# Patient Record
Sex: Male | Born: 1980
Health system: Southern US, Community
[De-identification: ages and names within clinical notes are randomized; demographics above are authoritative.]

## PROBLEM LIST (undated history)

## (undated) ENCOUNTER — Ambulatory Visit: Payer: BLUE CROSS/BLUE SHIELD | Source: Home / Self Care

## (undated) DIAGNOSIS — R519 Headache, unspecified: Secondary | ICD-10-CM

## (undated) DIAGNOSIS — I10 Essential (primary) hypertension: Secondary | ICD-10-CM

## (undated) DIAGNOSIS — R51 Headache: Secondary | ICD-10-CM

## (undated) DIAGNOSIS — E119 Type 2 diabetes mellitus without complications: Secondary | ICD-10-CM

## (undated) HISTORY — DX: Headache, unspecified: R51.9

## (undated) HISTORY — DX: Type 2 diabetes mellitus without complications: E11.9

## (undated) HISTORY — DX: Headache: R51

## (undated) HISTORY — PX: ARTHROSCOPIC REPAIR ACL: SUR80

---

## 2000-04-01 ENCOUNTER — Encounter: Payer: Self-pay | Admitting: Emergency Medicine

## 2000-04-01 ENCOUNTER — Emergency Department (HOSPITAL_COMMUNITY): Admission: EM | Admit: 2000-04-01 | Discharge: 2000-04-01 | Payer: Self-pay | Admitting: Emergency Medicine

## 2001-11-23 ENCOUNTER — Emergency Department (HOSPITAL_COMMUNITY): Admission: EM | Admit: 2001-11-23 | Discharge: 2001-11-23 | Payer: Self-pay | Admitting: Emergency Medicine

## 2001-12-28 ENCOUNTER — Emergency Department (HOSPITAL_COMMUNITY): Admission: EM | Admit: 2001-12-28 | Discharge: 2001-12-28 | Payer: Self-pay

## 2003-03-20 ENCOUNTER — Emergency Department (HOSPITAL_COMMUNITY): Admission: EM | Admit: 2003-03-20 | Discharge: 2003-03-20 | Payer: Self-pay | Admitting: Emergency Medicine

## 2003-03-20 ENCOUNTER — Encounter: Payer: Self-pay | Admitting: Emergency Medicine

## 2003-08-24 ENCOUNTER — Emergency Department (HOSPITAL_COMMUNITY): Admission: AD | Admit: 2003-08-24 | Discharge: 2003-08-24 | Payer: Self-pay | Admitting: Family Medicine

## 2003-08-24 ENCOUNTER — Emergency Department (HOSPITAL_COMMUNITY): Admission: EM | Admit: 2003-08-24 | Discharge: 2003-08-24 | Payer: Self-pay | Admitting: Emergency Medicine

## 2004-08-21 ENCOUNTER — Emergency Department (HOSPITAL_COMMUNITY): Admission: EM | Admit: 2004-08-21 | Discharge: 2004-08-21 | Payer: Self-pay | Admitting: Emergency Medicine

## 2006-01-27 ENCOUNTER — Emergency Department (HOSPITAL_COMMUNITY): Admission: EM | Admit: 2006-01-27 | Discharge: 2006-01-27 | Payer: Self-pay | Admitting: Emergency Medicine

## 2007-08-25 ENCOUNTER — Emergency Department (HOSPITAL_COMMUNITY): Admission: EM | Admit: 2007-08-25 | Discharge: 2007-08-25 | Payer: Self-pay | Admitting: Emergency Medicine

## 2008-02-11 ENCOUNTER — Emergency Department (HOSPITAL_COMMUNITY): Admission: EM | Admit: 2008-02-11 | Discharge: 2008-02-11 | Payer: Self-pay | Admitting: Emergency Medicine

## 2008-04-30 ENCOUNTER — Emergency Department (HOSPITAL_COMMUNITY): Admission: EM | Admit: 2008-04-30 | Discharge: 2008-04-30 | Payer: Self-pay | Admitting: Emergency Medicine

## 2012-11-16 ENCOUNTER — Encounter (HOSPITAL_COMMUNITY): Payer: Self-pay | Admitting: *Deleted

## 2012-11-16 ENCOUNTER — Emergency Department (HOSPITAL_COMMUNITY)
Admission: EM | Admit: 2012-11-16 | Discharge: 2012-11-16 | Disposition: A | Discharge status: Home / Self Care | Payer: Self-pay | Attending: Emergency Medicine | Admitting: Emergency Medicine

## 2012-11-16 DIAGNOSIS — J029 Acute pharyngitis, unspecified: Secondary | ICD-10-CM | POA: Insufficient documentation

## 2012-11-16 DIAGNOSIS — R52 Pain, unspecified: Secondary | ICD-10-CM | POA: Insufficient documentation

## 2012-11-16 DIAGNOSIS — J3489 Other specified disorders of nose and nasal sinuses: Secondary | ICD-10-CM | POA: Insufficient documentation

## 2012-11-16 DIAGNOSIS — I10 Essential (primary) hypertension: Secondary | ICD-10-CM | POA: Insufficient documentation

## 2012-11-16 DIAGNOSIS — R51 Headache: Secondary | ICD-10-CM | POA: Insufficient documentation

## 2012-11-16 DIAGNOSIS — R6889 Other general symptoms and signs: Secondary | ICD-10-CM

## 2012-11-16 DIAGNOSIS — R112 Nausea with vomiting, unspecified: Secondary | ICD-10-CM | POA: Insufficient documentation

## 2012-11-16 HISTORY — DX: Essential (primary) hypertension: I10

## 2012-11-16 MED ORDER — SODIUM CHLORIDE 0.9 % IV BOLUS (SEPSIS)
1000.0000 mL | Freq: Once | INTRAVENOUS | Status: AC
  Administered 2012-11-16: 1000 mL via INTRAVENOUS

## 2012-11-16 MED ORDER — KETOROLAC TROMETHAMINE 30 MG/ML IJ SOLN
30.0000 mg | Freq: Once | INTRAMUSCULAR | Status: AC
  Administered 2012-11-16: 30 mg via INTRAVENOUS
  Filled 2012-11-16: qty 1

## 2012-11-16 NOTE — ED Notes (Signed)
Pt reports headache, sore throat, bodyaches, fever, n/v since Sunday.

## 2012-11-16 NOTE — ED Provider Notes (Signed)
History     CSN: [id number]  Arrival date & time 11/16/12  1713   First MD Initiated Contact with Patient 11/16/12 1722      Chief Complaint  Patient presents with  . Influenza    (Consider location/radiation/quality/duration/timing/severity/associated sxs/prior treatment) Patient is a 32 y.o. male presenting with general illness. The history is provided by the patient.  Illness  The current episode started 3 to 5 days ago. The onset was gradual. The problem occurs continuously. The problem has been unchanged. The problem is mild. The symptoms are relieved by one or more OTC medications. Associated symptoms include a fever, vomiting, congestion, rhinorrhea, sore throat and muscle aches. Pertinent negatives include no photophobia, no abdominal pain, no diarrhea, no nausea, no headaches, no cough, no wheezing and no rash. He has been behaving normally. He has been eating less than usual. Urine output has been normal. There were no sick contacts. He has received no recent medical care.    Past Medical History  Diagnosis Date  . Hypertension     History reviewed. No pertinent past surgical history.  History reviewed. No pertinent family history.  History  Substance Use Topics  . Smoking status: Not on file  . Smokeless tobacco: Not on file  . Alcohol Use: No      Review of Systems  Constitutional: Positive for fever. Negative for fatigue.  HENT: Positive for congestion, sore throat and rhinorrhea. Negative for postnasal drip.   Eyes: Negative for photophobia and visual disturbance.  Respiratory: Negative for cough, chest tightness, shortness of breath and wheezing.   Cardiovascular: Negative for chest pain, palpitations and leg swelling.  Gastrointestinal: Positive for vomiting. Negative for nausea, abdominal pain and diarrhea.  Genitourinary: Negative for urgency, frequency and difficulty urinating.  Musculoskeletal: Negative for back pain and arthralgias.  Skin: Negative  for rash and wound.  Neurological: Negative for weakness and headaches.  Psychiatric/Behavioral: Negative for confusion and agitation.    Allergies  Review of patient's allergies indicates no known allergies.  Home Medications  No current outpatient prescriptions on file.  BP 117/70  Pulse 87  Temp(Src) 98.9 F (37.2 C) (Oral)  Resp 18  SpO2 97%  Physical Exam  Nursing note and vitals reviewed. Constitutional: He is oriented to person, place, and time. He appears well-developed and well-nourished. No distress.  HENT:  Head: Normocephalic and atraumatic.  Mouth/Throat: Oropharynx is clear and moist.  Eyes: EOM are normal. Pupils are equal, round, and reactive to light.  Neck: Normal range of motion. Neck supple.  No nuchal rigidity.  Cardiovascular: Normal rate, regular rhythm, normal heart sounds and intact distal pulses.   Pulmonary/Chest: Effort normal and breath sounds normal. He has no wheezes. He has no rales.  Lung fields clear  Abdominal: Soft. Bowel sounds are normal. He exhibits no distension. There is no tenderness. There is no rebound and no guarding.  Musculoskeletal: Normal range of motion. He exhibits no edema and no tenderness.  Lymphadenopathy:    He has no cervical adenopathy.  Neurological: He is alert and oriented to person, place, and time. He displays normal reflexes. No cranial nerve deficit. He exhibits normal muscle tone. Coordination normal.  Skin: Skin is warm and dry. No rash noted.  Psychiatric: He has a normal mood and affect. His behavior is normal.    ED Course  Procedures (including critical care time)  Labs Reviewed - No data to display No results found.   1. Flu-like symptoms  MDM  44M here with flu-like illness for three days. Symptoms include headache, sore throat, rhinorrhea, and myalgias. He has been taking Tylenol and Nyquil with some relief. Exam as noted above. No concern for serious bacterial illness. 02 sats 96% and  no cough with clear lung fields. Do not think he has pneumonia. No meningitic signs. Will treat symptomatically with IVF and Toradol. Not a candidate for Tamiflu.  HR has improved. Pt feeling better. 02 sats remain 97% on RA. Pt deemed stable for d/c home. Return precautions given.      Johnnette Gourd, MD 11/16/12 2029   I saw and evaluated the patient, reviewed the resident's note and I agree with the findings and plan.  Derwood Kaplan, MD 11/16/12 2311

## 2013-07-12 ENCOUNTER — Telehealth: Payer: Self-pay

## 2013-07-12 MED ORDER — METOPROLOL TARTRATE 25 MG PO TABS: 12.5000 mg | ORAL_TABLET | Freq: Two times a day (BID) | ORAL | Status: DC

## 2013-07-12 MED ORDER — METOPROLOL SUCCINATE 12.5 MG HALF TABLET: 12.5000 mg | ORAL_TABLET | Freq: Two times a day (BID) | ORAL | Status: DC

## 2013-07-12 NOTE — Telephone Encounter (Addendum)
Call from patient who is scheduled for a New Est with Dr.Lowne on 09/13/13 and he called advising he thinks his BP was elevated because he has been waking up with Headaches, he has been on BP med's in the past but is not currently taking anything. He is currently not having any blurred vision, no numbness, no chest pain, patient came into the office and his BP was 150/110, no visual distress and the patient says he feels ok other than the headache. Pulse was 75 and O2 was 97. I tried to get the patient to see DOD and he declined. Discussed with Dr.Paz and he advised the patient to start the Metoprolol 12.5 1 tablet by mouth BID and follow up tomorrow with Dr.Lowne as scheduled. Patient voiced understanding. 12.5 was not available so Dr.Paz said it was ok to take the 25 mg at 1/2 tablet bid.  Rx sent to [facility] and the new patient packet was given.      KP

## 2013-07-13 ENCOUNTER — Encounter: Payer: Self-pay | Admitting: Family Medicine

## 2013-07-13 ENCOUNTER — Ambulatory Visit (INDEPENDENT_AMBULATORY_CARE_PROVIDER_SITE_OTHER): Payer: PRIVATE HEALTH INSURANCE | Admitting: Family Medicine

## 2013-07-13 VITALS — BP 122/90 | HR 59 | Temp 98.4°F | Ht 71.5 in | Wt 222.4 lb

## 2013-07-13 DIAGNOSIS — I10 Essential (primary) hypertension: Secondary | ICD-10-CM

## 2013-07-13 NOTE — Progress Notes (Signed)
  Subjective:    Thomas King is a 32 y.o. male who presents for evaluation of elevated blood pressures. Age at onset of elevated blood pressure:  30. Cardiac symptoms: none. Patient denies: chest pain, chest pressure/discomfort, claudication, dyspnea, exertional chest pressure/discomfort, fatigue, irregular heart beat, lower extremity edema, near-syncope, orthopnea, palpitations, paroxysmal nocturnal dyspnea, syncope and tachypnea. Cardiovascular risk factors: hypertension and male gender. Use of agents associated with hypertension: none. History of target organ damage: none.  The following portions of the patient's history were reviewed and updated as appropriate:  He  has a past medical history of Hypertension and Frequent headaches. He  does not have a problem list on file. He  has past surgical history that includes Arthroscopic repair ACL (Left). His family history is not on file. He  reports that he has never smoked. He has never used smokeless tobacco. He reports that he drinks alcohol. He reports that he does not use illicit drugs. He has a current medication list which includes the following prescription(s): metoprolol tartrate. Current Outpatient Prescriptions on File Prior to Visit  Medication Sig Dispense Refill  . metoprolol tartrate (LOPRESSOR) 25 MG tablet Take 0.5 tablets (12.5 mg total) by mouth 2 (two) times daily.  30 tablet  0   No current facility-administered medications on file prior to visit.   He has No Known Allergies..  Review of Systems Pertinent items are noted in HPI.   Objective:    BP 122/90  Pulse 59  Temp(Src) 98.4 F (36.9 C) (Oral)  Ht 5' 11.5" (1.816 m)  Wt 222 lb 6.4 oz (100.88 kg)  BMI 30.59 kg/m2  SpO2 96% General appearance: alert, cooperative, appears stated age and no distress Neck: no adenopathy, no carotid bruit, no JVD, supple, symmetrical, trachea midline and thyroid not enlarged, symmetric, no tenderness/mass/nodules Lungs: clear to  auscultation bilaterally Heart: S1, S2 normal Extremities: extremities normal, atraumatic, no cyanosis or edema  Cardiographics ECG: sinus bradycardia    Assessment:    Hypertension, stage 1 . Evidence of target organ damage: none.    Plan:    Medication: no change. Dietary sodium restriction. Regular aerobic exercise. Check blood pressures 2-3 times weekly and record. Follow up: 3 weeks and as needed.  Pt just started meds yesterday---  Check labs

## 2013-07-13 NOTE — Patient Instructions (Signed)

## 2013-07-27 ENCOUNTER — Ambulatory Visit: Payer: PRIVATE HEALTH INSURANCE | Admitting: Family Medicine

## 2013-07-28 ENCOUNTER — Ambulatory Visit (INDEPENDENT_AMBULATORY_CARE_PROVIDER_SITE_OTHER): Payer: PRIVATE HEALTH INSURANCE | Admitting: Family Medicine

## 2013-07-28 ENCOUNTER — Encounter: Payer: Self-pay | Admitting: Family Medicine

## 2013-07-28 VITALS — BP 130/95 | HR 73 | Temp 98.5°F | Wt 220.0 lb

## 2013-07-28 DIAGNOSIS — I10 Essential (primary) hypertension: Secondary | ICD-10-CM

## 2013-07-28 MED ORDER — METOPROLOL TARTRATE 25 MG PO TABS: ORAL_TABLET | ORAL | Status: DC

## 2013-07-28 NOTE — Patient Instructions (Addendum)
  rto 3 months for ov and fasting labs for bp f/u   Hypertension As your heart beats, it forces blood through your arteries. This force is your blood pressure. If the pressure is too high, it is called hypertension (HTN) or high blood pressure. HTN is dangerous because you may have it and not know it. High blood pressure may mean that your heart has to work harder to pump blood. Your arteries may be narrow or stiff. The extra work puts you at risk for heart disease, stroke, and other problems.  Blood pressure consists of two numbers, a higher number over a lower, 110/72, for example. It is stated as "110 over 72." The ideal is below 120 for the top number (systolic) and under 80 for the bottom (diastolic). Write down your blood pressure today. You should pay close attention to your blood pressure if you have certain conditions such as:  Heart failure.  Prior heart attack.  Diabetes  Chronic kidney disease.  Prior stroke.  Multiple risk factors for heart disease. To see if you have HTN, your blood pressure should be measured while you are seated with your arm held at the level of the heart. It should be measured at least twice. A one-time elevated blood pressure reading (especially in the Emergency Department) does not mean that you need treatment. There may be conditions in which the blood pressure is different between your right and left arms. It is important to see your caregiver soon for a recheck. Most people have essential hypertension which means that there is not a specific cause. This type of high blood pressure may be lowered by changing lifestyle factors such as:  Stress.  Smoking.  Lack of exercise.  Excessive weight.  Drug/tobacco/alcohol use.  Eating less salt. Most people do not have symptoms from high blood pressure until it has caused damage to the body. Effective treatment can often prevent, delay or reduce that damage. TREATMENT  When a cause has been identified,  treatment for high blood pressure is directed at the cause. There are a large number of medications to treat HTN. These fall into several categories, and your caregiver will help you select the medicines that are best for you. Medications may have side effects. You should review side effects with your caregiver. If your blood pressure stays high after you have made lifestyle changes or started on medicines,   Your medication(s) may need to be changed.  Other problems may need to be addressed.  Be certain you understand your prescriptions, and know how and when to take your medicine.  Be sure to follow up with your caregiver within the time frame advised (usually within two weeks) to have your blood pressure rechecked and to review your medications.  If you are taking more than one medicine to lower your blood pressure, make sure you know how and at what times they should be taken. Taking two medicines at the same time can result in blood pressure that is too low. SEEK IMMEDIATE MEDICAL CARE IF:  You develop a severe headache, blurred or changing vision, or confusion.  You have unusual weakness or numbness, or a faint feeling.  You have severe chest or abdominal pain, vomiting, or breathing problems. MAKE SURE YOU:   Understand these instructions.  Will watch your condition.  Will get help right away if you are not doing well or get worse. Document Released: 09/21/2005 Document Revised: 12/14/2011 Document Reviewed: 05/11/2008 [facility] Patient Information 2014 [place], [place].

## 2013-07-29 ENCOUNTER — Encounter: Payer: Self-pay | Admitting: Family Medicine

## 2013-07-29 DIAGNOSIS — Z6834 Body mass index (BMI) 34.0-34.9, adult: Secondary | ICD-10-CM | POA: Insufficient documentation

## 2013-07-29 DIAGNOSIS — E669 Obesity, unspecified: Secondary | ICD-10-CM | POA: Insufficient documentation

## 2013-07-29 NOTE — Progress Notes (Signed)
  Subjective:    Patient here for follow-up of elevated blood pressure.  He is exercising and is adherent to a low-salt diet.  Blood pressure is not checked at home. Cardiac symptoms: none. Patient denies: chest pain, chest pressure/discomfort, claudication, dyspnea, exertional chest pressure/discomfort, fatigue, irregular heart beat, lower extremity edema, near-syncope, orthopnea, palpitations, paroxysmal nocturnal dyspnea, syncope and tachypnea. Cardiovascular risk factors: hypertension and male gender. Use of agents associated with hypertension: none. History of target organ damage: none.  The following portions of the patient's history were reviewed and updated as appropriate: allergies, current medications, past family history, past medical history, past social history, past surgical history and problem list.  Review of Systems Pertinent items are noted in HPI.     Objective:    BP 130/95  Pulse 73  Temp(Src) 98.5 F (36.9 C) (Oral)  Wt 220 lb (99.791 kg)  BMI 30.26 kg/m2  SpO2 97% General appearance: alert, cooperative, appears stated age and no distress Neck: no adenopathy, no carotid bruit, no JVD, supple, symmetrical, trachea midline and thyroid not enlarged, symmetric, no tenderness/mass/nodules Lungs: clear to auscultation bilaterally Heart: S1, S2 normal Abdomen: soft, non-tender; bowel sounds normal; no masses,  no organomegaly Extremities: extremities normal, atraumatic, no cyanosis or edema    Assessment:    Hypertension, stage 1 . Evidence of target organ damage: none.    Plan:    Medication: increase to metoprolol 25 mg bid. Dietary sodium restriction. Regular aerobic exercise. Follow up: 3 months and as needed.

## 2013-09-13 ENCOUNTER — Encounter: Payer: Self-pay | Admitting: Family Medicine

## 2013-09-13 ENCOUNTER — Ambulatory Visit (INDEPENDENT_AMBULATORY_CARE_PROVIDER_SITE_OTHER): Payer: PRIVATE HEALTH INSURANCE | Admitting: Family Medicine

## 2013-09-13 VITALS — BP 132/98 | HR 57 | Temp 98.2°F | Ht 71.0 in | Wt 224.8 lb

## 2013-09-13 DIAGNOSIS — I10 Essential (primary) hypertension: Secondary | ICD-10-CM

## 2013-09-13 DIAGNOSIS — Z0000 Encounter for general adult medical examination without abnormal findings: Secondary | ICD-10-CM

## 2013-09-13 MED ORDER — METOPROLOL TARTRATE 25 MG PO TABS: ORAL_TABLET | ORAL | Status: DC

## 2013-09-13 MED ORDER — AMLODIPINE BESYLATE 5 MG PO TABS: 5.0000 mg | ORAL_TABLET | Freq: Every day | ORAL | Status: DC

## 2013-09-13 NOTE — Progress Notes (Signed)
   Subjective:    Patient ID: Thomas King, male    DOB: [birth year], 32 y.o.   MRN: [id number]  HPI Pt here for cpe -- no complaints.     Review of Systems Review of Systems  Constitutional: Negative for activity change, appetite change and fatigue.  HENT: Negative for hearing loss, congestion, tinnitus and ear discharge.  dentist q53m Eyes: Negative for visual disturbance (see optho q1y -- vision corrected to 20/20 with glasses).  Respiratory: Negative for cough, chest tightness and shortness of breath.   Cardiovascular: Negative for chest pain, palpitations and leg swelling.  Gastrointestinal: Negative for abdominal pain, diarrhea, constipation and abdominal distention.  Genitourinary: Negative for urgency, frequency, decreased urine volume and difficulty urinating.  Musculoskeletal: Negative for back pain, arthralgias and gait problem.  Skin: Negative for color change, pallor and rash.  Neurological: Negative for dizziness, light-headedness, numbness and headaches.  Hematological: Negative for adenopathy. Does not bruise/bleed easily.  Psychiatric/Behavioral: Negative for suicidal ideas, confusion, sleep disturbance, self-injury, dysphoric mood, decreased concentration and agitation.   Past Medical History  Diagnosis Date  . Hypertension   . Frequent headaches    History   Social History  . Marital Status: Single    Spouse Name: N/A    Number of Children: N/A  . Years of Education: N/A   Occupational History  . Not on file.   Social History Main Topics  . Smoking status: Never Smoker   . Smokeless tobacco: Never Used  . Alcohol Use: Yes     Comment: Occ  . Drug Use: No  . Sexual Activity: Not on file   Other Topics Concern  . Not on file   Social History Narrative  . No narrative on file   Past Surgical History  Procedure Laterality Date  . Arthroscopic repair acl Left     x's 2          Objective:   Physical Exam BP 132/98  Pulse 57  Temp(Src) 98.2  F (36.8 C) (Oral)  Ht 5\' 11"  (1.803 m)  Wt 224 lb 12.8 oz (101.969 kg)  BMI 31.37 kg/m2  SpO2 97% General appearance: alert, cooperative, appears stated age and no distress Head: Normocephalic, without obvious abnormality, atraumatic Eyes: conjunctivae/corneas clear. PERRL, EOM's intact. Fundi benign. Ears: normal TM's and external ear canals both ears Nose: Nares normal. Septum midline. Mucosa normal. No drainage or sinus tenderness. Throat: lips, mucosa, and tongue normal; teeth and gums normal Neck: no adenopathy, no carotid bruit, no JVD, supple, symmetrical, trachea midline and thyroid not enlarged, symmetric, no tenderness/mass/nodules Back: symmetric, no curvature. ROM normal. No CVA tenderness. Lungs: clear to auscultation bilaterally Chest wall: no tenderness Heart: regular rate and rhythm, S1, S2 normal, no murmur, click, rub or gallop Abdomen: soft, non-tender; bowel sounds normal; no masses,  no organomegaly Male genitalia: normal Rectal: normal tone, normal prostate, no masses or tenderness Extremities: edema none Pulses: 2+ and symmetric Skin: Skin color, texture, turgor normal. No rashes or lesions Lymph nodes: Cervical, supraclavicular, and axillary nodes normal. Neurologic: Alert and oriented X 3, normal strength and tone. Normal symmetric reflexes. Normal coordination and gait Psych-- no depression, no anxiety       Assessment & Plan:

## 2013-09-13 NOTE — Patient Instructions (Addendum)
RTO in 3 weeks for bp check and fasting labs    Preventive Care for Adults, Male A healthy lifestyle and preventive care can promote health and wellness. Preventive health guidelines for men include the following key practices:  A routine yearly physical is a good way to check with your caregiver about your health and preventative screening. It is a chance to share any concerns and updates on your health, and to receive a thorough exam.  Visit your dentist for a routine exam and preventative care every 6 months. Brush your teeth twice a day and floss once a day. Good oral hygiene prevents tooth decay and gum disease.  The frequency of eye exams is based on your age, health, family medical history, use of contact lenses, and other factors. Follow your caregiver's recommendations for frequency of eye exams.  Eat a healthy diet. Foods like vegetables, fruits, whole grains, low-fat dairy products, and lean protein foods contain the nutrients you need without too many calories. Decrease your intake of foods high in solid fats, added sugars, and salt. Eat the right amount of calories for you.Get information about a proper diet from your caregiver, if necessary.  Regular physical exercise is one of the most important things you can do for your health. Most adults should get at least 150 minutes of moderate-intensity exercise (any activity that increases your heart rate and causes you to sweat) each week. In addition, most adults need muscle-strengthening exercises on 2 or more days a week.  Maintain a healthy weight. The body mass index (BMI) is a screening tool to identify possible weight problems. It provides an estimate of body fat based on height and weight. Your caregiver can help determine your BMI, and can help you achieve or maintain a healthy weight.For adults 20 years and older:  A BMI below 18.5 is considered underweight.  A BMI of 18.5 to 24.9 is normal.  A BMI of 25 to 29.9 is  considered overweight.  A BMI of 30 and above is considered obese.  Maintain normal blood lipids and cholesterol levels by exercising and minimizing your intake of saturated fat. Eat a balanced diet with plenty of fruit and vegetables. Blood tests for lipids and cholesterol should begin at age 40 and be repeated every 5 years. If your lipid or cholesterol levels are high, you are over 50, or you are a high risk for heart disease, you may need your cholesterol levels checked more frequently.Ongoing high lipid and cholesterol levels should be treated with medicines if diet and exercise are not effective.  If you smoke, find out from your caregiver how to quit. If you do not use tobacco, do not start.  Lung cancer screening is recommended for adults aged 32 80 years who are at high risk for developing lung cancer because of a history of smoking. Yearly low-dose computed tomography (CT) is recommended for people who have at least a 30-pack-year history of smoking and are a current smoker or have quit within the past 15 years. A pack year of smoking is smoking an average of 1 pack of cigarettes a day for 1 year (for example: 1 pack a day for 30 years or 2 packs a day for 15 years). Yearly screening should continue until the smoker has stopped smoking for at least 15 years. Yearly screening should also be stopped for people who develop a health problem that would prevent them from having lung cancer treatment.  If you choose to drink alcohol, do not  exceed 2 drinks per day. One drink is considered to be 12 ounces (355 mL) of beer, 5 ounces (148 mL) of wine, or 1.5 ounces (44 mL) of liquor.  Avoid use of street drugs. Do not share needles with anyone. Ask for help if you need support or instructions about stopping the use of drugs.  High blood pressure causes heart disease and increases the risk of stroke. Your blood pressure should be checked at least every 1 to 2 years. Ongoing high blood pressure should  be treated with medicines, if weight loss and exercise are not effective.  If you are 70 to 32 years old, ask your caregiver if you should take aspirin to prevent heart disease.  Diabetes screening involves taking a blood sample to check your fasting blood sugar level. This should be done once every 3 years, after age 66, if you are within normal weight and without risk factors for diabetes. Testing should be considered at a younger age or be carried out more frequently if you are overweight and have at least 1 risk factor for diabetes.  Colorectal cancer can be detected and often prevented. Most routine colorectal cancer screening begins at the age of 22 and continues through age 56. However, your caregiver may recommend screening at an earlier age if you have risk factors for colon cancer. On a yearly basis, your caregiver may provide home test kits to check for hidden blood in the stool. Use of a small camera at the end of a tube, to directly examine the colon (sigmoidoscopy or colonoscopy), can detect the earliest forms of colorectal cancer. Talk to your caregiver about this at age 12, when routine screening begins. Direct examination of the colon should be repeated every 5 to 10 years through age 27, unless early forms of pre-cancerous polyps or small growths are found.  Hepatitis C blood testing is recommended for all people born from [birth year] through 1965 and any individual with known risks for hepatitis C.  Practice safe sex. Use condoms and avoid high-risk sexual practices to reduce the spread of sexually transmitted infections (STIs). STIs include gonorrhea, chlamydia, syphilis, trichomonas, herpes, HPV, and human immunodeficiency virus (HIV). Herpes, HIV, and HPV are viral illnesses that have no cure. They can result in disability, cancer, and death.  A one-time screening for abdominal aortic aneurysm (AAA) and surgical repair of large AAAs by sound wave imaging (ultrasonography) is recommended  for ages 18 to 55 years who are current or former smokers.  Healthy men should no longer receive prostate-specific antigen (PSA) blood tests as part of routine cancer screening. Consult with your caregiver about prostate cancer screening.  Testicular cancer screening is not recommended for adult males who have no symptoms. Screening includes self-exam, caregiver exam, and other screening tests. Consult with your caregiver about any symptoms you have or any concerns you have about testicular cancer.  Use sunscreen. Apply sunscreen liberally and repeatedly throughout the day. You should seek shade when your shadow is shorter than you. Protect yourself by wearing long sleeves, pants, a wide-brimmed hat, and sunglasses year round, whenever you are outdoors.  Once a month, do a whole body skin exam, using a mirror to look at the skin on your back. Notify your caregiver of new moles, moles that have irregular borders, moles that are larger than a pencil eraser, or moles that have changed in shape or color.  Stay current with required immunizations.  Influenza vaccine. All adults should be immunized every year.  Tetanus,  diphtheria, and acellular pertussis (Td, Tdap) vaccine. An adult who has not previously received Tdap or who does not know his vaccine status should receive 1 dose of Tdap. This initial dose should be followed by tetanus and diphtheria toxoids (Td) booster doses every 10 years. Adults with an unknown or incomplete history of completing a 3-dose immunization series with Td-containing vaccines should begin or complete a primary immunization series including a Tdap dose. Adults should receive a Td booster every 10 years.  Varicella vaccine. An adult without evidence of immunity to varicella should receive 2 doses or a second dose if he has previously received 1 dose.  Human papillomavirus (HPV) vaccine. Males aged 41 21 years who have not received the vaccine previously should receive the  3-dose series. Males aged 25 26 years may be immunized. Immunization is recommended through the age of 59 years for any male who has sex with males and did not get any or all doses earlier. Immunization is recommended for any person with an immunocompromised condition through the age of 26 years if he did not get any or all doses earlier. During the 3-dose series, the second dose should be obtained 4 8 weeks after the first dose. The third dose should be obtained 24 weeks after the first dose and 16 weeks after the second dose.  Zoster vaccine. One dose is recommended for adults aged 29 years or older unless certain conditions are present.  Measles, mumps, and rubella (MMR) vaccine. Adults born before [birth year] generally are considered immune to measles and mumps. Adults born in [birth year] or later should have 1 or more doses of MMR vaccine unless there is a contraindication to the vaccine or there is laboratory evidence of immunity to each of the three diseases. A routine second dose of MMR vaccine should be obtained at least 28 days after the first dose for students attending postsecondary schools, health care workers, or international travelers. People who received inactivated measles vaccine or an unknown type of measles vaccine during 1963 1967 should receive 2 doses of MMR vaccine. People who received inactivated mumps vaccine or an unknown type of mumps vaccine before 1979 and are at high risk for mumps infection should consider immunization with 2 doses of MMR vaccine. Unvaccinated health care workers born before [birth year] who lack laboratory evidence of measles, mumps, or rubella immunity or laboratory confirmation of disease should consider measles and mumps immunization with 2 doses of MMR vaccine or rubella immunization with 1 dose of MMR vaccine.  Pneumococcal 13-valent conjugate (PCV13) vaccine. When indicated, a person who is uncertain of his immunization history and has no record of immunization should receive  the PCV13 vaccine. An adult aged 56 years or older who has certain medical conditions and has not been previously immunized should receive 1 dose of PCV13 vaccine. This PCV13 should be followed with a dose of pneumococcal polysaccharide (PPSV23) vaccine. The PPSV23 vaccine dose should be obtained at least 8 weeks after the dose of PCV13 vaccine. An adult aged 71 years or older who has certain medical conditions and previously received 1 or more doses of PPSV23 vaccine should receive 1 dose of PCV13. The PCV13 vaccine dose should be obtained 1 or more years after the last PPSV23 vaccine dose.  Pneumococcal polysaccharide (PPSV23) vaccine. When PCV13 is also indicated, PCV13 should be obtained first. All adults aged 56 years and older should be immunized. An adult younger than age 34 years who has certain medical conditions should be immunized. Any person  who resides in a nursing home or long-term care facility should be immunized. An adult smoker should be immunized. People with an immunocompromised condition and certain other conditions should receive both PCV13 and PPSV23 vaccines. People with human immunodeficiency virus (HIV) infection should be immunized as soon as possible after diagnosis. Immunization during chemotherapy or radiation therapy should be avoided. Routine use of PPSV23 vaccine is not recommended for American Indians, [address], or people younger than 65 years unless there are medical conditions that require PPSV23 vaccine. When indicated, people who have unknown immunization and have no record of immunization should receive PPSV23 vaccine. One-time revaccination 5 years after the first dose of PPSV23 is recommended for people aged 74 64 years who have chronic kidney failure, nephrotic syndrome, asplenia, or immunocompromised conditions. People who received 1 2 doses of PPSV23 before age 69 years should receive another dose of PPSV23 vaccine at age 79 years or later if at least 5 years have  passed since the previous dose. Doses of PPSV23 are not needed for people immunized with PPSV23 at or after age 56 years.  Meningococcal vaccine. Adults with asplenia or persistent complement component deficiencies should receive 2 doses of quadrivalent meningococcal conjugate (MenACWY-D) vaccine. The doses should be obtained at least 2 months apart. Microbiologists working with certain meningococcal bacteria, military recruits, people at risk during an outbreak, and people who travel to or live in countries with a high rate of meningitis should be immunized. A first-year college student up through age 22 years who is living in a residence hall should receive a dose if he did not receive a dose on or after his 16th birthday. Adults who have certain high-risk conditions should receive one or more doses of vaccine.  Hepatitis A vaccine. Adults who wish to be protected from this disease, have certain high-risk conditions, work with hepatitis A-infected animals, work in hepatitis A research labs, or travel to or work in countries with a high rate of hepatitis A should be immunized. Adults who were previously unvaccinated and who anticipate close contact with an international adoptee during the first 60 days after arrival in the [place] from a country with a high rate of hepatitis A should be immunized.  Hepatitis B vaccine. Adults who wish to be protected from this disease, have certain high-risk conditions, may be exposed to blood or other infectious body fluids, are household contacts or sex partners of hepatitis B positive people, are clients or workers in certain care facilities, or travel to or work in countries with a high rate of hepatitis B should be immunized.  Haemophilus influenzae type b (Hib) vaccine. A previously unvaccinated person with asplenia or sickle cell disease or having a scheduled splenectomy should receive 1 dose of Hib vaccine. Regardless of previous immunization, a recipient of a  hematopoietic stem cell transplant should receive a 3-dose series 6 12 months after his successful transplant. Hib vaccine is not recommended for adults with HIV infection. Preventive Service / Frequency Ages 11 to 51  Blood pressure check.** / Every 1 to 2 years.  Lipid and cholesterol check.** / Every 5 years beginning at age 5.  Hepatitis C blood test.** / For any individual with known risks for hepatitis C.  Skin self-exam. / Monthly.  Influenza vaccine. / Every year.  Tetanus, diphtheria, and acellular pertussis (Tdap, Td) vaccine.** / Consult your caregiver. 1 dose of Td every 10 years.  Varicella vaccine.** / Consult your caregiver.  HPV vaccine. / 3 doses over 6  months, if 3 and younger.  Measles, mumps, rubella (MMR) vaccine.** / You need at least 1 dose of MMR if you were born in 1957 or later. You may also need a 2nd dose.  Pneumococcal 13-valent conjugate (PCV13) vaccine.** / Consult your caregiver.  Pneumococcal polysaccharide (PPSV23) vaccine.** / 1 to 2 doses if you smoke cigarettes or if you have certain conditions.  Meningococcal vaccine.** / 1 dose if you are age 70 to 66 years and a Orthoptist living in a residence hall, or have one of several medical conditions, you need to get vaccinated against meningococcal disease. You may also need additional booster doses.  Hepatitis A vaccine.** / Consult your caregiver.  Hepatitis B vaccine.** / Consult your caregiver.  Haemophilus influenzae type b (Hib) vaccine.** / Consult your caregiver. Ages 12 to 78  Blood pressure check.** / Every 1 to 2 years.  Lipid and cholesterol check.** / Every 5 years beginning at age 27.  Lung cancer screening. / Every year if you are aged 72 80 years and have a 30-pack-year history of smoking and currently smoke or have quit within the past 15 years. Yearly screening is stopped once you have quit smoking for at least 15 years or develop a health problem that would  prevent you from having lung cancer treatment.  Fecal occult blood test (FOBT) of stool. / Every year beginning at age 19 and continuing until age 72. You may not have to do this test if you get colonoscopy every 10 years.  Flexible sigmoidoscopy** or colonoscopy.** / Every 5 years for a flexible sigmoidoscopy or every 10 years for a colonoscopy beginning at age 73 and continuing until age 42.  Hepatitis C blood test.** / For all people born from [birth year] through 1965 and any individual with known risks for hepatitis C.  Skin self-exam. / Monthly.  Influenza vaccine. / Every year.  Tetanus, diphtheria, and acellular pertussis (Tdap/Td) vaccine.** / Consult your caregiver. 1 dose of Td every 10 years.  Varicella vaccine.** / Consult your caregiver.  Zoster vaccine.** / 1 dose for adults aged 29 years or older.  Measles, mumps, rubella (MMR) vaccine.** / You need at least 1 dose of MMR if you were born in 1957 or later. You may also need a 2nd dose.  Pneumococcal 13-valent conjugate (PCV13) vaccine.** / Consult your caregiver.  Pneumococcal polysaccharide (PPSV23) vaccine.** / 1 to 2 doses if you smoke cigarettes or if you have certain conditions.  Meningococcal vaccine.** / Consult your caregiver.  Hepatitis A vaccine.** / Consult your caregiver.  Hepatitis B vaccine.** / Consult your caregiver.  Haemophilus influenzae type b (Hib) vaccine.** / Consult your caregiver. Ages 25 and over  Blood pressure check.** / Every 1 to 2 years.  Lipid and cholesterol check.**/ Every 5 years beginning at age 17.  Lung cancer screening. / Every year if you are aged 62 80 years and have a 30-pack-year history of smoking and currently smoke or have quit within the past 15 years. Yearly screening is stopped once you have quit smoking for at least 15 years or develop a health problem that would prevent you from having lung cancer treatment.  Fecal occult blood test (FOBT) of stool. / Every year  beginning at age 58 and continuing until age 35. You may not have to do this test if you get colonoscopy every 10 years.  Flexible sigmoidoscopy** or colonoscopy.** / Every 5 years for a flexible sigmoidoscopy or every 10 years for a colonoscopy beginning  at age 80 and continuing until age 46.  Hepatitis C blood test.** / For all people born from [birth year] through 1965 and any individual with known risks for hepatitis C.  Abdominal aortic aneurysm (AAA) screening.** / A one-time screening for ages 68 to 19 years who are current or former smokers.  Skin self-exam. / Monthly.  Influenza vaccine. / Every year.  Tetanus, diphtheria, and acellular pertussis (Tdap/Td) vaccine.** / 1 dose of Td every 10 years.  Varicella vaccine.** / Consult your caregiver.  Zoster vaccine.** / 1 dose for adults aged 57 years or older.  Pneumococcal 13-valent conjugate (PCV13) vaccine.** / Consult your caregiver.  Pneumococcal polysaccharide (PPSV23) vaccine.** / 1 dose for all adults aged 14 years and older.  Meningococcal vaccine.** / Consult your caregiver.  Hepatitis A vaccine.** / Consult your caregiver.  Hepatitis B vaccine.** / Consult your caregiver.  Haemophilus influenzae type b (Hib) vaccine.** / Consult your caregiver. **Family history and personal history of risk and conditions may change your caregiver's recommendations. Document Released: 11/17/2001 Document Revised: 01/16/2013 Document Reviewed: 02/16/2011 [facility] Patient Information 2014 [place], [place].

## 2013-09-13 NOTE — Progress Notes (Signed)
Pre visit review using our clinic review tool, if applicable. No additional management support is needed unless otherwise documented below in the visit note. 

## 2013-09-14 ENCOUNTER — Encounter: Payer: Self-pay | Admitting: Family Medicine

## 2013-10-04 ENCOUNTER — Ambulatory Visit: Payer: PRIVATE HEALTH INSURANCE | Admitting: Family Medicine

## 2013-10-09 ENCOUNTER — Telehealth: Payer: Self-pay

## 2013-10-09 NOTE — Telephone Encounter (Signed)
We will discuss on 1/7

## 2013-10-09 NOTE — Telephone Encounter (Signed)
Call from patient who stated he needed a referral for Ortho for knee pain to past surgeries. He advised this was discussed with Dr.Lowne during his CPE. No supporting documentation. Please advise       KP

## 2013-10-09 NOTE — Telephone Encounter (Signed)
Patient has been made aware and voiced understanding.     KP 

## 2013-10-11 ENCOUNTER — Encounter: Payer: Self-pay | Admitting: Family Medicine

## 2013-10-11 ENCOUNTER — Ambulatory Visit (INDEPENDENT_AMBULATORY_CARE_PROVIDER_SITE_OTHER): Payer: PRIVATE HEALTH INSURANCE | Admitting: Family Medicine

## 2013-10-11 VITALS — BP 122/88 | HR 62 | Temp 98.5°F | Wt 228.2 lb

## 2013-10-11 DIAGNOSIS — M25569 Pain in unspecified knee: Secondary | ICD-10-CM

## 2013-10-11 DIAGNOSIS — Z0000 Encounter for general adult medical examination without abnormal findings: Secondary | ICD-10-CM

## 2013-10-11 DIAGNOSIS — M25562 Pain in left knee: Secondary | ICD-10-CM | POA: Insufficient documentation

## 2013-10-11 DIAGNOSIS — I10 Essential (primary) hypertension: Secondary | ICD-10-CM

## 2013-10-11 LAB — POCT URINALYSIS DIPSTICK
Bilirubin, UA: NEGATIVE
Blood, UA: NEGATIVE
Glucose, UA: NEGATIVE
Ketones, UA: NEGATIVE
LEUKOCYTES UA: NEGATIVE
NITRITE UA: NEGATIVE
Spec Grav, UA: 1.01
UROBILINOGEN UA: 0.2
pH, UA: 7

## 2013-10-11 LAB — CBC WITH DIFFERENTIAL/PLATELET
BASOS ABS: 0 10*3/uL (ref 0.0–0.1)
BASOS PCT: 0.4 % (ref 0.0–3.0)
EOS ABS: 0.4 10*3/uL (ref 0.0–0.7)
Eosinophils Relative: 6.1 % — ABNORMAL HIGH (ref 0.0–5.0)
HEMATOCRIT: 42.7 % (ref 39.0–52.0)
Hemoglobin: 14.4 g/dL (ref 13.0–17.0)
LYMPHS ABS: 1.7 10*3/uL (ref 0.7–4.0)
LYMPHS PCT: 25.2 % (ref 12.0–46.0)
MCHC: 33.8 g/dL (ref 30.0–36.0)
MCV: 88.3 fl (ref 78.0–100.0)
Monocytes Absolute: 0.5 10*3/uL (ref 0.1–1.0)
Monocytes Relative: 7.3 % (ref 3.0–12.0)
Neutro Abs: 4.1 10*3/uL (ref 1.4–7.7)
Neutrophils Relative %: 61 % (ref 43.0–77.0)
Platelets: 238 10*3/uL (ref 150.0–400.0)
RBC: 4.83 Mil/uL (ref 4.22–5.81)
RDW: 12.6 % (ref 11.5–14.6)
WBC: 6.8 10*3/uL (ref 4.5–10.5)

## 2013-10-11 LAB — TSH: TSH: 0.97 u[IU]/mL (ref 0.35–5.50)

## 2013-10-11 LAB — LIPID PANEL
CHOL/HDL RATIO: 6
CHOLESTEROL: 143 mg/dL (ref 0–200)
HDL: 24.8 mg/dL — ABNORMAL LOW (ref >39.00)
LDL Cholesterol: 82 mg/dL (ref 0–99)
Triglycerides: 181 mg/dL — ABNORMAL HIGH (ref 0.0–149.0)
VLDL: 36.2 mg/dL (ref 0.0–40.0)

## 2013-10-11 LAB — BASIC METABOLIC PANEL
BUN: 11 mg/dL (ref 6–23)
CALCIUM: 8.9 mg/dL (ref 8.4–10.5)
CO2: 29 mEq/L (ref 19–32)
CREATININE: 1.2 mg/dL (ref 0.4–1.5)
Chloride: 106 mEq/L (ref 96–112)
GFR: 93.43 mL/min (ref >60.00)
GLUCOSE: 75 mg/dL (ref 70–99)
Potassium: 3.8 mEq/L (ref 3.5–5.1)
Sodium: 140 mEq/L (ref 135–145)

## 2013-10-11 LAB — HEPATIC FUNCTION PANEL
ALBUMIN: 4 g/dL (ref 3.5–5.2)
ALT: 27 U/L (ref 0–53)
AST: 24 U/L (ref 0–37)
Alkaline Phosphatase: 77 U/L (ref 39–117)
BILIRUBIN DIRECT: 0.1 mg/dL (ref 0.0–0.3)
BILIRUBIN TOTAL: 0.9 mg/dL (ref 0.3–1.2)
TOTAL PROTEIN: 7.1 g/dL (ref 6.0–8.3)

## 2013-10-11 LAB — MICROALBUMIN / CREATININE URINE RATIO
CREATININE, U: 205.6 mg/dL
MICROALB UR: 0.4 mg/dL (ref 0.0–1.9)
Microalb Creat Ratio: 0.2 mg/g (ref 0.0–30.0)

## 2013-10-11 NOTE — Progress Notes (Signed)
  Subjective:    Patient here for follow-up of elevated blood pressure.  He is exercising and is adherent to a low-salt diet.  Blood pressure is well controlled at home. Cardiac symptoms: none. Patient denies: chest pain, chest pressure/discomfort, claudication, dyspnea, exertional chest pressure/discomfort, fatigue, irregular heart beat, lower extremity edema, near-syncope, orthopnea, palpitations, paroxysmal nocturnal dyspnea, syncope and tachypnea. Cardiovascular risk factors: hypertension and male gender. Use of agents associated with hypertension: none. History of target organ damage: none.  The following portions of the patient's history were reviewed and updated as appropriate:  He  has a past medical history of Hypertension and Frequent headaches. He  does not have any pertinent problems on file. He  has past surgical history that includes Arthroscopic repair ACL (Left). His family history includes Hypertension in his mother. He  reports that he has never smoked. He has never used smokeless tobacco. He reports that he drinks alcohol. He reports that he does not use illicit drugs. He has a current medication list which includes the following prescription(s): amlodipine and metoprolol tartrate. Current Outpatient Prescriptions on File Prior to Visit  Medication Sig Dispense Refill  . amLODipine (NORVASC) 5 MG tablet Take 1 tablet (5 mg total) by mouth daily.  90 tablet  3  . metoprolol tartrate (LOPRESSOR) 25 MG tablet 1 po bid  60 tablet  2   No current facility-administered medications on file prior to visit.   He has No Known Allergies..  Review of Systems Pertinent items are noted in HPI.     Objective:    BP 122/88  Pulse 62  Temp(Src) 98.5 F (36.9 C) (Oral)  Wt 228 lb 3.2 oz (103.511 kg)  SpO2 96% General appearance: alert, cooperative, appears stated age and no distress Throat: lips, mucosa, and tongue normal; teeth and gums normal Neck: no adenopathy, no carotid  bruit, no JVD, supple, symmetrical, trachea midline and thyroid not enlarged, symmetric, no tenderness/mass/nodules Lungs: clear to auscultation bilaterally Heart: S1, S2 normal Extremities: L knee--+ crepitus, no swelling    Assessment:    Hypertension, stage 1 . Evidence of target organ damage: none.    Plan:    Medication: no change. Dietary sodium restriction. Regular aerobic exercise. Check blood pressures 2-3 times weekly and record. Follow up: 3 months and as needed.

## 2013-10-11 NOTE — Assessment & Plan Note (Signed)
Hx surgery [facility] in [facility] Knee sleeve Refer to ortho

## 2013-10-11 NOTE — Progress Notes (Signed)
Pre visit review using our clinic review tool, if applicable. No additional management support is needed unless otherwise documented below in the visit note. 

## 2013-10-11 NOTE — Patient Instructions (Signed)

## 2013-11-06 ENCOUNTER — Telehealth: Payer: Self-pay | Admitting: Family Medicine

## 2013-11-06 NOTE — Telephone Encounter (Signed)
Relevant patient education mailed to patient.  

## 2014-01-09 ENCOUNTER — Encounter: Payer: Self-pay | Admitting: Family Medicine

## 2014-01-09 ENCOUNTER — Ambulatory Visit (INDEPENDENT_AMBULATORY_CARE_PROVIDER_SITE_OTHER): Payer: Self-pay | Admitting: Family Medicine

## 2014-01-09 VITALS — BP 120/74 | HR 58 | Temp 98.7°F | Wt 212.0 lb

## 2014-01-09 DIAGNOSIS — I10 Essential (primary) hypertension: Secondary | ICD-10-CM

## 2014-01-09 DIAGNOSIS — E785 Hyperlipidemia, unspecified: Secondary | ICD-10-CM

## 2014-01-09 MED ORDER — METOPROLOL TARTRATE 25 MG PO TABS: ORAL_TABLET | ORAL | Status: DC

## 2014-01-09 MED ORDER — AMLODIPINE BESYLATE 5 MG PO TABS: 5.0000 mg | ORAL_TABLET | Freq: Every day | ORAL | Status: DC

## 2014-01-09 NOTE — Patient Instructions (Signed)

## 2014-01-09 NOTE — Progress Notes (Signed)
  Subjective:    Patient here for follow-up of elevated blood pressure.  He is exercising and is adherent to a low-salt diet.  Blood pressure is well controlled at home. Cardiac symptoms: none. Patient denies: chest pain, chest pressure/discomfort, claudication, dyspnea, exertional chest pressure/discomfort, fatigue, irregular heart beat, lower extremity edema, near-syncope, orthopnea, palpitations, paroxysmal nocturnal dyspnea, syncope and tachypnea. Cardiovascular risk factors: dyslipidemia, hypertension and male gender. Use of agents associated with hypertension: none. History of target organ damage: none.  The following portions of the patient's history were reviewed and updated as appropriate: allergies, current medications, past family history, past medical history, past social history, past surgical history and problem list.  Review of Systems Pertinent items are noted in HPI.     Objective:    BP 120/74  Pulse 58  Temp(Src) 98.7 F (37.1 C) (Oral)  Wt 212 lb (96.163 kg)  SpO2 98% General appearance: alert, cooperative, appears stated age and no distress Lungs: clear to auscultation bilaterally Heart: S1, S2 normal Extremities: extremities normal, atraumatic, no cyanosis or edema    Assessment:    Hypertension, normal blood pressure . Evidence of target organ damage: none.    Plan:    Medication: no change. Dietary sodium restriction. Regular aerobic exercise. Follow up: 6 months and as needed. ---labs to be done soon

## 2014-01-09 NOTE — Progress Notes (Signed)
Pre visit review using our clinic review tool, if applicable. No additional management support is needed unless otherwise documented below in the visit note. 

## 2014-01-11 ENCOUNTER — Other Ambulatory Visit (INDEPENDENT_AMBULATORY_CARE_PROVIDER_SITE_OTHER): Payer: Self-pay

## 2014-01-11 DIAGNOSIS — E785 Hyperlipidemia, unspecified: Secondary | ICD-10-CM

## 2014-01-11 DIAGNOSIS — I10 Essential (primary) hypertension: Secondary | ICD-10-CM

## 2014-01-11 LAB — HEPATIC FUNCTION PANEL
ALBUMIN: 3.8 g/dL (ref 3.5–5.2)
ALK PHOS: 85 U/L (ref 39–117)
ALT: 23 U/L (ref 0–53)
AST: 20 U/L (ref 0–37)
Bilirubin, Direct: 0 mg/dL (ref 0.0–0.3)
Total Bilirubin: 0.8 mg/dL (ref 0.3–1.2)
Total Protein: 7.5 g/dL (ref 6.0–8.3)

## 2014-01-11 LAB — BASIC METABOLIC PANEL
BUN: 10 mg/dL (ref 6–23)
CO2: 26 mEq/L (ref 19–32)
CREATININE: 1 mg/dL (ref 0.4–1.5)
Calcium: 9.1 mg/dL (ref 8.4–10.5)
Chloride: 105 mEq/L (ref 96–112)
GFR: 108.21 mL/min (ref >60.00)
GLUCOSE: 79 mg/dL (ref 70–99)
Potassium: 3.6 mEq/L (ref 3.5–5.1)
SODIUM: 139 meq/L (ref 135–145)

## 2014-01-11 LAB — LIPID PANEL
Cholesterol: 109 mg/dL (ref 0–200)
HDL: 25.5 mg/dL — AB (ref >39.00)
LDL Cholesterol: 55 mg/dL (ref 0–99)
Total CHOL/HDL Ratio: 4
Triglycerides: 144 mg/dL (ref 0.0–149.0)
VLDL: 28.8 mg/dL (ref 0.0–40.0)

## 2014-03-30 ENCOUNTER — Emergency Department (HOSPITAL_BASED_OUTPATIENT_CLINIC_OR_DEPARTMENT_OTHER): Payer: Self-pay

## 2014-03-30 ENCOUNTER — Emergency Department (HOSPITAL_BASED_OUTPATIENT_CLINIC_OR_DEPARTMENT_OTHER)
Admission: EM | Admit: 2014-03-30 | Discharge: 2014-03-30 | Disposition: A | Discharge status: Home / Self Care | Payer: Self-pay | Attending: Emergency Medicine | Admitting: Emergency Medicine

## 2014-03-30 ENCOUNTER — Encounter (HOSPITAL_BASED_OUTPATIENT_CLINIC_OR_DEPARTMENT_OTHER): Payer: Self-pay | Admitting: Emergency Medicine

## 2014-03-30 DIAGNOSIS — I10 Essential (primary) hypertension: Secondary | ICD-10-CM | POA: Insufficient documentation

## 2014-03-30 DIAGNOSIS — Z79899 Other long term (current) drug therapy: Secondary | ICD-10-CM | POA: Insufficient documentation

## 2014-03-30 DIAGNOSIS — S8990XA Unspecified injury of unspecified lower leg, initial encounter: Secondary | ICD-10-CM | POA: Insufficient documentation

## 2014-03-30 DIAGNOSIS — S99929A Unspecified injury of unspecified foot, initial encounter: Principal | ICD-10-CM

## 2014-03-30 DIAGNOSIS — Y9289 Other specified places as the place of occurrence of the external cause: Secondary | ICD-10-CM | POA: Insufficient documentation

## 2014-03-30 DIAGNOSIS — X500XXA Overexertion from strenuous movement or load, initial encounter: Secondary | ICD-10-CM | POA: Insufficient documentation

## 2014-03-30 DIAGNOSIS — M25562 Pain in left knee: Secondary | ICD-10-CM

## 2014-03-30 DIAGNOSIS — Y9367 Activity, basketball: Secondary | ICD-10-CM | POA: Insufficient documentation

## 2014-03-30 DIAGNOSIS — S99919A Unspecified injury of unspecified ankle, initial encounter: Secondary | ICD-10-CM | POA: Insufficient documentation

## 2014-03-30 MED ORDER — IBUPROFEN 800 MG PO TABS
800.0000 mg | ORAL_TABLET | Freq: Once | ORAL | Status: AC
  Administered 2014-03-30: 800 mg via ORAL
  Filled 2014-03-30: qty 1

## 2014-03-30 MED ORDER — IBUPROFEN 800 MG PO TABS: 800.0000 mg | ORAL_TABLET | Freq: Three times a day (TID) | ORAL | Status: DC

## 2014-03-30 MED ORDER — HYDROCODONE-ACETAMINOPHEN 5-325 MG PO TABS: 1.0000 | ORAL_TABLET | ORAL | Status: DC | PRN

## 2014-03-30 NOTE — ED Provider Notes (Signed)
Medical screening examination/treatment/procedure(s) were performed by non-physician practitioner and as supervising physician I was immediately available for consultation/collaboration.   EKG Interpretation None        Rolan BuccoMelanie Belfi, MD 03/30/14 2256

## 2014-03-30 NOTE — ED Provider Notes (Signed)
CSN: [id number]     Arrival date & time 03/30/14  2014 History   First MD Initiated Contact with Patient 03/30/14 2039     Chief Complaint  Patient presents with  . Knee Pain     (Consider location/radiation/quality/duration/timing/severity/associated sxs/prior Treatment) Patient is a 33 y.o. male presenting with knee pain. The history is provided by the patient. No language interpreter was used.  Knee Pain Location:  Knee Injury: yes   Mechanism of injury comment:  Twisting injury while playing basketball Knee location:  L knee Associated symptoms: no fever   Associated symptoms comment:  Knee pain and swelling since playing basketball yesterday. No other injury.   Past Medical History  Diagnosis Date  . Hypertension   . Frequent headaches    Past Surgical History  Procedure Laterality Date  . Arthroscopic repair acl Left     x's 2    Family History  Problem Relation Age of Onset  . Hypertension Mother    History  Substance Use Topics  . Smoking status: Never Smoker   . Smokeless tobacco: Never Used  . Alcohol Use: Yes     Comment: Occ    Review of Systems  Constitutional: Negative for fever and chills.  Musculoskeletal: Negative.        See HPI  Skin: Negative.   Neurological: Negative.       Allergies  Review of patient's allergies indicates no known allergies.  Home Medications   Prior to Admission medications   Medication Sig Start Date End Date Taking? Authorizing Provider  amLODipine (NORVASC) 5 MG tablet Take 1 tablet (5 mg total) by mouth daily. 01/09/14   Lelon PerlaYvonne R Lowne, DO  metoprolol tartrate (LOPRESSOR) 25 MG tablet 1 po bid 01/09/14   Grayling CongressYvonne R Lowne, DO   BP 122/82  Pulse 81  Temp(Src) 98.3 F (36.8 C) (Oral)  Resp 18  Ht 5\' 10"  (1.778 m)  Wt 211 lb (95.709 kg)  BMI 30.28 kg/m2  SpO2 98% Physical Exam  Constitutional: He is oriented to person, place, and time. He appears well-developed and well-nourished.  Neck: Normal range of  motion.  Cardiovascular: Intact distal pulses.   Pulmonary/Chest: Effort normal.  Musculoskeletal: Normal range of motion.  Left knee without appreciated swelling. No discoloration or deformity. Joint stable. Tender over medial joint line.  Neurological: He is alert and oriented to person, place, and time.  Skin: Skin is warm and dry.  Psychiatric: He has a normal mood and affect.    ED Course  Procedures (including critical care time) Labs Review Labs Reviewed - No data to display  Imaging Review Dg Knee Complete 4 Views Left  03/30/2014   CLINICAL DATA:  Knee pain.  EXAM: LEFT KNEE - COMPLETE 4+ VIEW  COMPARISON:  None.  FINDINGS: Moderate knee joint effusion.  No acute fracture or dislocation.  Status post ACL repair. No comparison imaging available to document stability of the tunnel diameter and retention device. Mild osteoarthritic marginal spurring, without notable joint narrowing. Chronic appearing tiny ossified structures along the medial tibial plateau.  IMPRESSION: 1. No acute osseous findings. 2. Joint effusion. 3. Status post ACL repair. 4. Mild knee osteoarthritis.   Electronically Signed   By: Tiburcio PeaJonathan  Watts M.D.   On: 03/30/2014 21:05     EKG Interpretation None      MDM   Final diagnoses:  None    1. Knee pain  No apparent joint injury or tendon laxity. Knee immobilizer given with crutches. Will  refer to orthopedics if conservative therapy fails.    Arnoldo HookerShari A Upstill, PA-C 03/30/14 2136

## 2014-03-30 NOTE — Discharge Instructions (Signed)
Cryotherapy Cryotherapy means treatment with cold. Ice or gel packs can be used to reduce both pain and swelling. Ice is the most helpful within the first 24 to 48 hours after an injury or flareup from overusing a muscle or joint. Sprains, strains, spasms, burning pain, shooting pain, and aches can all be eased with ice. Ice can also be used when recovering from surgery. Ice is effective, has very few side effects, and is safe for most people to use. PRECAUTIONS  Ice is not a safe treatment option for people with:  Raynaud's phenomenon. This is a condition affecting small blood vessels in the extremities. Exposure to cold may cause your problems to return.  Cold hypersensitivity. There are many forms of cold hypersensitivity, including:  Cold urticaria. Red, itchy hives appear on the skin when the tissues begin to warm after being iced.  Cold erythema. This is a red, itchy rash caused by exposure to cold.  Cold hemoglobinuria. Red blood cells break down when the tissues begin to warm after being iced. The hemoglobin that carry oxygen are passed into the urine because they cannot combine with blood proteins fast enough.  Numbness or altered sensitivity in the area being iced. If you have any of the following conditions, do not use ice until you have discussed cryotherapy with your caregiver:  Heart conditions, such as arrhythmia, angina, or chronic heart disease.  High blood pressure.  Healing wounds or open skin in the area being iced.  Current infections.  Rheumatoid arthritis.  Poor circulation.  Diabetes. Ice slows the blood flow in the region it is applied. This is beneficial when trying to stop inflamed tissues from spreading irritating chemicals to surrounding tissues. However, if you expose your skin to cold temperatures for too long or without the proper protection, you can damage your skin or nerves. Watch for signs of skin damage due to cold. HOME CARE INSTRUCTIONS Follow  these tips to use ice and cold packs safely.  Place a dry or damp towel between the ice and skin. A damp towel will cool the skin more quickly, so you may need to shorten the time that the ice is used.  For a more rapid response, add gentle compression to the ice.  Ice for no more than 10 to 20 minutes at a time. The bonier the area you are icing, the less time it will take to get the benefits of ice.  Check your skin after 5 minutes to make sure there are no signs of a poor response to cold or skin damage.  Rest 20 minutes or more in between uses.  Once your skin is numb, you can end your treatment. You can test numbness by very lightly touching your skin. The touch should be so light that you do not see the skin dimple from the pressure of your fingertip. When using ice, most people will feel these normal sensations in this order: cold, burning, aching, and numbness.  Do not use ice on someone who cannot communicate their responses to pain, such as small children or people with dementia. HOW TO MAKE AN ICE PACK Ice packs are the most common way to use ice therapy. Other methods include ice massage, ice baths, and cryo-sprays. Muscle creams that cause a cold, tingly feeling do not offer the same benefits that ice offers and should not be used as a substitute unless recommended by your caregiver. To make an ice pack, do one of the following:  Place crushed ice or   a bag of frozen vegetables in a sealable plastic bag. Squeeze out the excess air. Place this bag inside another plastic bag. Slide the bag into a pillowcase or place a damp towel between your skin and the bag.  Mix 3 parts water with 1 part rubbing alcohol. Freeze the mixture in a sealable plastic bag. When you remove the mixture from the freezer, it will be slushy. Squeeze out the excess air. Place this bag inside another plastic bag. Slide the bag into a pillowcase or place a damp towel between your skin and the bag. SEEK MEDICAL  CARE IF:  You develop white spots on your skin. This may give the skin a blotchy (mottled) appearance.  Your skin turns blue or pale.  Your skin becomes waxy or hard.  Your swelling gets worse. MAKE SURE YOU:   Understand these instructions.  Will watch your condition.  Will get help right away if you are not doing well or get worse. Document Released: 05/18/2011 Document Revised: 12/14/2011 Document Reviewed: 05/18/2011 [employer] Patient Information 2015 [employer]. This information is not intended to replace advice given to you by your health care provider. Make sure you discuss any questions you have with your health care provider.  

## 2014-03-30 NOTE — ED Notes (Signed)
Pt playing basketball today, noticed after the game that it was swollen and painful, no injury noted to knee during the game, pt has had 2 acl surgeries in past to left knee

## 2014-09-14 ENCOUNTER — Other Ambulatory Visit: Payer: Self-pay | Admitting: Medical

## 2014-09-14 ENCOUNTER — Ambulatory Visit (INDEPENDENT_AMBULATORY_CARE_PROVIDER_SITE_OTHER): Payer: Self-pay | Admitting: Medical

## 2014-09-14 ENCOUNTER — Encounter: Payer: Self-pay | Admitting: Medical

## 2014-09-14 ENCOUNTER — Ambulatory Visit (HOSPITAL_BASED_OUTPATIENT_CLINIC_OR_DEPARTMENT_OTHER)
Admission: RE | Admit: 2014-09-14 | Discharge: 2014-09-14 | Disposition: A | Discharge status: Home / Self Care | Payer: Self-pay | Source: Ambulatory Visit | Attending: Medical | Admitting: Medical

## 2014-09-14 VITALS — BP 133/85 | HR 55 | Temp 98.4°F | Wt 214.0 lb

## 2014-09-14 DIAGNOSIS — M25531 Pain in right wrist: Secondary | ICD-10-CM

## 2014-09-14 DIAGNOSIS — S52209A Unspecified fracture of shaft of unspecified ulna, initial encounter for closed fracture: Secondary | ICD-10-CM | POA: Insufficient documentation

## 2014-09-14 DIAGNOSIS — S52201A Unspecified fracture of shaft of right ulna, initial encounter for closed fracture: Secondary | ICD-10-CM

## 2014-09-14 DIAGNOSIS — W1839XA Other fall on same level, initial encounter: Secondary | ICD-10-CM | POA: Insufficient documentation

## 2014-09-14 DIAGNOSIS — Y9367 Activity, basketball: Secondary | ICD-10-CM | POA: Insufficient documentation

## 2014-09-14 MED ORDER — DICLOFENAC SODIUM 75 MG PO TBEC: 75.0000 mg | DELAYED_RELEASE_TABLET | Freq: Two times a day (BID) | ORAL | Status: DC

## 2014-09-14 MED ORDER — TRAMADOL HCL 50 MG PO TABS: 50.0000 mg | ORAL_TABLET | Freq: Three times a day (TID) | ORAL | Status: DC | PRN

## 2014-09-14 NOTE — Patient Instructions (Addendum)
We will send you down stairs for stat xray of rt forearm and rt wrist. Stay there until they call me with the results and I will advise you if you have a fracture or not.   If fracture then will try to get you in with sports medicine today or [facility].  I am prescribing diclofenac nsaid for pain and inflamation. For moderate pain tramadol  Radiology reported rt ulna fracture. I tried to get him in with sports medicine but they are closed at noon. So gave information to be seen by [facility] after hours clinic. Pt states that he will go today. 

## 2014-09-14 NOTE — Progress Notes (Signed)
Pre visit review using our clinic review tool, if applicable. No additional management support is needed unless otherwise documented below in the visit note. 

## 2014-09-14 NOTE — Progress Notes (Signed)
   Subjective:    Patient ID: Thomas King, male    DOB: 07/27/1981, 33 y.o.   MRN: [id number]  HPI  Pt in states last week on Monday about 12 days or more he fell backward and tried to brace his fall but hurt his rt forearm. His wrist with movement. Pt tried ibuprofen helps a little bit.  Past Medical History  Diagnosis Date  . Hypertension   . Frequent headaches     History   Social History  . Marital Status: Single    Spouse Name: N/A    Number of Children: N/A  . Years of Education: N/A   Occupational History  .      [employer] -- [employer]   Social History Main Topics  . Smoking status: Never Smoker   . Smokeless tobacco: Never Used  . Alcohol Use: Yes     Comment: Occ  . Drug Use: No  . Sexual Activity:    Partners: Female    Pharmacist, hospitalBirth Control/ Protection: Condom   Other Topics Concern  . Not on file   Social History Narrative   Exercise-- occassional    Past Surgical History  Procedure Laterality Date  . Arthroscopic repair acl Left     x's 2     Family History  Problem Relation Age of Onset  . Hypertension Mother     No Known Allergies  Current Outpatient Prescriptions on File Prior to Visit  Medication Sig Dispense Refill  . amLODipine (NORVASC) 5 MG tablet Take 1 tablet (5 mg total) by mouth daily. 90 tablet 3  . ibuprofen (ADVIL,MOTRIN) 800 MG tablet Take 1 tablet (800 mg total) by mouth 3 (three) times daily. 21 tablet 0  . metoprolol tartrate (LOPRESSOR) 25 MG tablet 1 po bid 180 tablet 3   No current facility-administered medications on file prior to visit.    BP 133/85 mmHg  Pulse 55  Temp(Src) 98.4 F (36.9 C) (Oral)  Wt 214 lb (97.07 kg)  SpO2 98%    Review of Systems  Constitutional: Negative for fever, chills and fatigue.  Respiratory: Negative for cough, shortness of breath and wheezing.   Cardiovascular: Negative for chest pain and palpitations.  Musculoskeletal:       Rt wrist and forearm pain.  Neurological: Negative for  numbness.       Rt arm  Hematological: Negative for adenopathy. Does not bruise/bleed easily.       Rt arm.       Objective:   Physical Exam   General- No acute distress. Lungs- CTA Heart- RRR Rt elbow- from of motion no pain. Palpation no pain. Rt forearm- distal 1/3 aspect mild swollen and tender to palpation. Rt wrist- no pain on palpation but on flexio and extension distal ulna tip region pain.  Rt upper extremity- pulse intact. Neurovascular intact.        Assessment & Plan:

## 2014-09-14 NOTE — Assessment & Plan Note (Signed)
We will send you down stairs for stat xray of rt forearm and rt wrist. Stay there until they call me with the results and I will advise you if you have a fracture or not.   If fracture then will try to get you in with sports medicine today or [facility] clinic.  I am prescribing diclofenac nsaid for pain and inflamation. For moderate pain tramadol  Radiology reported rt ulna fracture. I tried to get him in with sports medicine but they are closed at noon. [facility] Pt states that he will go today.

## 2014-10-01 ENCOUNTER — Encounter: Payer: Self-pay | Admitting: Family Medicine

## 2014-10-01 ENCOUNTER — Ambulatory Visit (INDEPENDENT_AMBULATORY_CARE_PROVIDER_SITE_OTHER): Payer: Self-pay | Admitting: Family Medicine

## 2014-10-01 VITALS — BP 127/83 | HR 60 | Temp 98.2°F | Wt 215.2 lb

## 2014-10-01 DIAGNOSIS — I10 Essential (primary) hypertension: Secondary | ICD-10-CM | POA: Insufficient documentation

## 2014-10-01 DIAGNOSIS — E785 Hyperlipidemia, unspecified: Secondary | ICD-10-CM

## 2014-10-01 MED ORDER — METOPROLOL TARTRATE 25 MG PO TABS: ORAL_TABLET | ORAL | Status: DC

## 2014-10-01 MED ORDER — AMLODIPINE BESYLATE 5 MG PO TABS: 5.0000 mg | ORAL_TABLET | Freq: Every day | ORAL | Status: DC

## 2014-10-01 NOTE — Progress Notes (Signed)
  Subjective:    Patient here for follow-up of elevated blood pressure.  He is exercising and is adherent to a low-salt diet.  Blood pressure is well controlled at home. Cardiac symptoms: none. Patient denies: chest pain, chest pressure/discomfort, claudication, dyspnea, exertional chest pressure/discomfort, fatigue, irregular heart beat, lower extremity edema, near-syncope, orthopnea, palpitations, paroxysmal nocturnal dyspnea, syncope and tachypnea. Cardiovascular risk factors: dyslipidemia, hypertension and male gender. Use of agents associated with hypertension: none. History of target organ damage: none. Pt is also here f/u lipids-- not fasting.  The following portions of the patient's history were reviewed and updated as appropriate: allergies, current medications, past family history, past medical history, past social history, past surgical history and problem list.  Review of Systems Pertinent items are noted in HPI.     Objective:    BP 127/83 mmHg  Pulse 60  Temp(Src) 98.2 F (36.8 C) (Oral)  Wt 215 lb 3.2 oz (97.614 kg)  SpO2 98% General appearance: alert, cooperative, appears stated age and no distress Throat: lips, mucosa, and tongue normal; teeth and gums normal Neck: no adenopathy, supple, symmetrical, trachea midline and thyroid not enlarged, symmetric, no tenderness/mass/nodules Lungs: clear to auscultation bilaterally Heart: S1, S2 normal Extremities: extremities normal, atraumatic, no cyanosis or edema    Assessment:    Hypertension, normal blood pressure . Evidence of target organ damage: none.    Plan:    Medication: no change. Regular aerobic exercise. Check blood pressures 2-3 times weekly and record. Follow up: 6 months and as needed.   rto in next week for labs

## 2014-10-01 NOTE — Patient Instructions (Signed)

## 2014-10-01 NOTE — Assessment & Plan Note (Signed)
Check labs 

## 2014-10-01 NOTE — Progress Notes (Signed)
Pre visit review using our clinic review tool, if applicable. No additional management support is needed unless otherwise documented below in the visit note. 

## 2014-10-02 ENCOUNTER — Other Ambulatory Visit (INDEPENDENT_AMBULATORY_CARE_PROVIDER_SITE_OTHER): Payer: Self-pay

## 2014-10-02 DIAGNOSIS — I10 Essential (primary) hypertension: Secondary | ICD-10-CM

## 2014-10-02 LAB — HEPATIC FUNCTION PANEL
ALT: 20 U/L (ref 0–53)
AST: 23 U/L (ref 0–37)
Albumin: 3.9 g/dL (ref 3.5–5.2)
Alkaline Phosphatase: 101 U/L (ref 39–117)
BILIRUBIN DIRECT: 0.1 mg/dL (ref 0.0–0.3)
BILIRUBIN TOTAL: 0.7 mg/dL (ref 0.2–1.2)
TOTAL PROTEIN: 7.3 g/dL (ref 6.0–8.3)

## 2014-10-02 LAB — BASIC METABOLIC PANEL
BUN: 9 mg/dL (ref 6–23)
CALCIUM: 9 mg/dL (ref 8.4–10.5)
CO2: 28 mEq/L (ref 19–32)
CREATININE: 1.3 mg/dL (ref 0.4–1.5)
Chloride: 107 mEq/L (ref 96–112)
GFR: 82.9 mL/min (ref >60.00)
Glucose, Bld: 101 mg/dL — ABNORMAL HIGH (ref 70–99)
Potassium: 3.7 mEq/L (ref 3.5–5.1)
Sodium: 140 mEq/L (ref 135–145)

## 2014-10-02 LAB — LIPID PANEL
CHOL/HDL RATIO: 6
Cholesterol: 137 mg/dL (ref 0–200)
HDL: 23.7 mg/dL — ABNORMAL LOW (ref >39.00)
LDL CALC: 90 mg/dL (ref 0–99)
NONHDL: 113.3
Triglycerides: 115 mg/dL (ref 0.0–149.0)
VLDL: 23 mg/dL (ref 0.0–40.0)

## 2015-04-02 ENCOUNTER — Ambulatory Visit: Payer: Self-pay | Admitting: Family Medicine

## 2015-04-09 ENCOUNTER — Ambulatory Visit: Payer: Self-pay | Admitting: Family Medicine

## 2015-04-22 ENCOUNTER — Ambulatory Visit (INDEPENDENT_AMBULATORY_CARE_PROVIDER_SITE_OTHER): Payer: Self-pay | Admitting: Family Medicine

## 2015-04-22 ENCOUNTER — Encounter: Payer: Self-pay | Admitting: Family Medicine

## 2015-04-22 VITALS — BP 118/74 | HR 74 | Temp 99.1°F | Ht 70.0 in | Wt 213.6 lb

## 2015-04-22 DIAGNOSIS — E785 Hyperlipidemia, unspecified: Secondary | ICD-10-CM

## 2015-04-22 DIAGNOSIS — I10 Essential (primary) hypertension: Secondary | ICD-10-CM

## 2015-04-22 MED ORDER — AMLODIPINE BESYLATE 5 MG PO TABS: 5.0000 mg | ORAL_TABLET | Freq: Every day | ORAL | Status: DC

## 2015-04-22 MED ORDER — METOPROLOL TARTRATE 25 MG PO TABS: ORAL_TABLET | ORAL | Status: DC

## 2015-04-22 NOTE — Patient Instructions (Signed)

## 2015-04-22 NOTE — Assessment & Plan Note (Signed)
Recheck labs today. 

## 2015-04-22 NOTE — Progress Notes (Signed)
Pre visit review using our clinic review tool, if applicable. No additional management support is needed unless otherwise documented below in the visit note. 

## 2015-04-22 NOTE — Assessment & Plan Note (Signed)
Stable con't meds rto 6 months 

## 2015-04-22 NOTE — Progress Notes (Signed)
Subjective:    Patient ID: Thomas King, male    DOB: 02/12/1981, 34 y.o.   MRN: [id number]  HPI  Patient here for f/u htn and cholesterol.  No cp, no sob, no palpitations.  Pt also needs refills on meds. . Past Medical History  Diagnosis Date  . Hypertension   . Frequent headaches     Review of Systems  Constitutional: Negative for diaphoresis, appetite change, fatigue and unexpected weight change.  Eyes: Negative for pain, redness and visual disturbance.  Respiratory: Negative for cough, chest tightness, shortness of breath and wheezing.   Cardiovascular: Negative for chest pain, palpitations and leg swelling.  Endocrine: Negative for cold intolerance, heat intolerance, polydipsia, polyphagia and polyuria.  Genitourinary: Negative for dysuria, frequency and difficulty urinating.  Neurological: Negative for dizziness, light-headedness, numbness and headaches.    No current outpatient prescriptions on file prior to visit.   No current facility-administered medications on file prior to visit.       Objective:    Physical Exam  Constitutional: He is oriented to person, place, and time. Vital signs are normal. He appears well-developed and well-nourished. He is sleeping.  HENT:  Head: Normocephalic and atraumatic.  Mouth/Throat: Oropharynx is clear and moist.  Eyes: EOM are normal. Pupils are equal, round, and reactive to light.  Neck: Normal range of motion. Neck supple. No thyromegaly present.  Cardiovascular: Normal rate and regular rhythm.   No murmur heard. Pulmonary/Chest: Effort normal and breath sounds normal. No respiratory distress. He has no wheezes. He has no rales. He exhibits no tenderness.  Musculoskeletal: He exhibits no edema or tenderness.  Neurological: He is alert and oriented to person, place, and time.  Skin: Skin is warm and dry.  Psychiatric: He has a normal mood and affect. His behavior is normal. Judgment and thought content normal.    BP 118/74  mmHg  Pulse 74  Temp(Src) 99.1 F (37.3 C) (Oral)  Ht 5\' 10"  (1.778 m)  Wt 213 lb 9.6 oz (96.888 kg)  BMI 30.65 kg/m2  SpO2 98% Wt Readings from Last 3 Encounters:  04/22/15 213 lb 9.6 oz (96.888 kg)  10/01/14 215 lb 3.2 oz (97.614 kg)  09/14/14 214 lb (97.07 kg)     Lab Results  Component Value Date   WBC 6.8 10/11/2013   HGB 14.4 10/11/2013   HCT 42.7 10/11/2013   PLT 238.0 10/11/2013   GLUCOSE 101* 10/02/2014   CHOL 137 10/02/2014   TRIG 115.0 10/02/2014   HDL 23.70* 10/02/2014   LDLCALC 90 10/02/2014   ALT 20 10/02/2014   AST 23 10/02/2014   NA 140 10/02/2014   K 3.7 10/02/2014   CL 107 10/02/2014   CREATININE 1.3 10/02/2014   BUN 9 10/02/2014   CO2 28 10/02/2014   TSH 0.97 10/11/2013   MICROALBUR 0.4 10/11/2013       Assessment & Plan:   Problem List Items Addressed This Visit    Hyperlipidemia    Recheck labs today      Relevant Medications   amLODipine (NORVASC) 5 MG tablet   metoprolol tartrate (LOPRESSOR) 25 MG tablet   Other Relevant Orders   Basic metabolic panel   Hepatic function panel   Lipid panel   HTN (hypertension) - Primary    Stable con't meds rto 6 months      Relevant Medications   amLODipine (NORVASC) 5 MG tablet   metoprolol tartrate (LOPRESSOR) 25 MG tablet   Other Relevant Orders   Basic  metabolic panel   Hepatic function panel   Lipid panel      I have discontinued Mr. Sohn ibuprofen, diclofenac, and traMADol. I am also having him maintain his amLODipine and metoprolol tartrate.  Meds ordered this encounter  Medications  . amLODipine (NORVASC) 5 MG tablet    Sig: Take 1 tablet (5 mg total) by mouth daily.    Dispense:  30 tablet    Refill:  5  . metoprolol tartrate (LOPRESSOR) 25 MG tablet    Sig: 1 po bid    Dispense:  180 tablet    Refill:  3     Loreen Freud, DO

## 2015-04-23 LAB — BASIC METABOLIC PANEL
BUN: 9 mg/dL (ref 6–23)
CALCIUM: 10.1 mg/dL (ref 8.4–10.5)
CO2: 30 meq/L (ref 19–32)
CREATININE: 1.32 mg/dL (ref 0.40–1.50)
Chloride: 102 mEq/L (ref 96–112)
GFR: 79.75 mL/min (ref >60.00)
Glucose, Bld: 64 mg/dL — ABNORMAL LOW (ref 70–99)
Potassium: 4 mEq/L (ref 3.5–5.1)
Sodium: 140 mEq/L (ref 135–145)

## 2015-04-23 LAB — HEPATIC FUNCTION PANEL
ALT: 21 U/L (ref 0–53)
AST: 19 U/L (ref 0–37)
Albumin: 4.4 g/dL (ref 3.5–5.2)
Alkaline Phosphatase: 90 U/L (ref 39–117)
Bilirubin, Direct: 0.1 mg/dL (ref 0.0–0.3)
Total Bilirubin: 0.6 mg/dL (ref 0.2–1.2)
Total Protein: 8.3 g/dL (ref 6.0–8.3)

## 2015-04-23 LAB — LIPID PANEL
Cholesterol: 127 mg/dL (ref 0–200)
HDL: 26 mg/dL — AB (ref >39.00)
LDL Cholesterol: 73 mg/dL (ref 0–99)
NonHDL: 101
TRIGLYCERIDES: 142 mg/dL (ref 0.0–149.0)
Total CHOL/HDL Ratio: 5
VLDL: 28.4 mg/dL (ref 0.0–40.0)

## 2015-10-28 ENCOUNTER — Ambulatory Visit (INDEPENDENT_AMBULATORY_CARE_PROVIDER_SITE_OTHER): Payer: BLUE CROSS/BLUE SHIELD | Admitting: Family Medicine

## 2015-10-28 ENCOUNTER — Encounter: Payer: Self-pay | Admitting: Family Medicine

## 2015-10-28 VITALS — BP 120/76 | HR 66 | Temp 99.0°F | Ht 70.0 in | Wt 216.6 lb

## 2015-10-28 DIAGNOSIS — E785 Hyperlipidemia, unspecified: Secondary | ICD-10-CM | POA: Diagnosis not present

## 2015-10-28 DIAGNOSIS — I10 Essential (primary) hypertension: Secondary | ICD-10-CM | POA: Diagnosis not present

## 2015-10-28 MED ORDER — AMLODIPINE BESYLATE 5 MG PO TABS: 5.0000 mg | ORAL_TABLET | Freq: Every day | ORAL | Status: DC

## 2015-10-28 MED ORDER — METOPROLOL TARTRATE 25 MG PO TABS: ORAL_TABLET | ORAL | Status: DC

## 2015-10-28 NOTE — Patient Instructions (Signed)
Hypertension Hypertension, commonly called high blood pressure, is when the force of blood pumping through your arteries is too strong. Your arteries are the blood vessels that carry blood from your heart throughout your body. A blood pressure reading consists of a higher number over a lower number, such as 110/72. The higher number (systolic) is the pressure inside your arteries when your heart pumps. The lower number (diastolic) is the pressure inside your arteries when your heart relaxes. Ideally you want your blood pressure below 120/80. Hypertension forces your heart to work harder to pump blood. Your arteries may become narrow or stiff. Having untreated or uncontrolled hypertension can cause heart attack, stroke, kidney disease, and other problems. RISK FACTORS Some risk factors for high blood pressure are controllable. Others are not.  Risk factors you cannot control include:   Race. You may be at higher risk if you are African American.  Age. Risk increases with age.  Gender. Men are at higher risk than women before age 45 years. After age 65, women are at higher risk than men. Risk factors you can control include:  Not getting enough exercise or physical activity.  Being overweight.  Getting too much fat, sugar, calories, or salt in your diet.  Drinking too much alcohol. SIGNS AND SYMPTOMS Hypertension does not usually cause signs or symptoms. Extremely high blood pressure (hypertensive crisis) may cause headache, anxiety, shortness of breath, and nosebleed. DIAGNOSIS To check if you have hypertension, your health care provider will measure your blood pressure while you are seated, with your arm held at the level of your heart. It should be measured at least twice using the same arm. Certain conditions can cause a difference in blood pressure between your right and left arms. A blood pressure reading that is higher than normal on one occasion does not mean that you need treatment. If  it is not clear whether you have high blood pressure, you may be asked to return on a different day to have your blood pressure checked again. Or, you may be asked to monitor your blood pressure at home for 1 or more weeks. TREATMENT Treating high blood pressure includes making lifestyle changes and possibly taking medicine. Living a healthy lifestyle can help lower high blood pressure. You may need to change some of your habits. Lifestyle changes may include:  Following the DASH diet. This diet is high in fruits, vegetables, and whole grains. It is low in salt, red meat, and added sugars.  Keep your sodium intake below 2,300 mg per day.  Getting at least 30-45 minutes of aerobic exercise at least 4 times per week.  Losing weight if necessary.  Not smoking.  Limiting alcoholic beverages.  Learning ways to reduce stress. Your health care provider may prescribe medicine if lifestyle changes are not enough to get your blood pressure under control, and if one of the following is true:  You are 18-59 years of age and your systolic blood pressure is above 140.  You are 60 years of age or older, and your systolic blood pressure is above 150.  Your diastolic blood pressure is above 90.  You have diabetes, and your systolic blood pressure is over 140 or your diastolic blood pressure is over 90.  You have kidney disease and your blood pressure is above 140/90.  You have heart disease and your blood pressure is above 140/90. Your personal target blood pressure may vary depending on your medical conditions, your age, and other factors. HOME CARE INSTRUCTIONS    Have your blood pressure rechecked as directed by your health care provider.   Take medicines only as directed by your health care provider. Follow the directions carefully. Blood pressure medicines must be taken as prescribed. The medicine does not work as well when you skip doses. Skipping doses also puts you at risk for  problems.  Do not smoke.   Monitor your blood pressure at home as directed by your health care provider. SEEK MEDICAL CARE IF:   You think you are having a reaction to medicines taken.  You have recurrent headaches or feel dizzy.  You have swelling in your ankles.  You have trouble with your vision. SEEK IMMEDIATE MEDICAL CARE IF:  You develop a severe headache or confusion.  You have unusual weakness, numbness, or feel faint.  You have severe chest or abdominal pain.  You vomit repeatedly.  You have trouble breathing. MAKE SURE YOU:   Understand these instructions.  Will watch your condition.  Will get help right away if you are not doing well or get worse.   This information is not intended to replace advice given to you by your health care provider. Make sure you discuss any questions you have with your health care provider.   Document Released: 09/21/2005 Document Revised: 02/05/2015 Document Reviewed: 07/14/2013 [employer] Interactive Patient Education 2016 [employer]  

## 2015-10-28 NOTE — Progress Notes (Signed)
Patient ID: Thomas King, male    DOB: 1981-09-01  Age: 35 y.o. MRN: [id number]    Subjective:  Subjective HPI AMOUS CREWE presents for htn f/u.  Pt stopped taking his meds.  He ran out and never called for refills.  Review of Systems  Constitutional: Negative for diaphoresis, appetite change, fatigue and unexpected weight change.  Eyes: Negative for pain, redness and visual disturbance.  Respiratory: Negative for cough, chest tightness, shortness of breath and wheezing.   Cardiovascular: Negative for chest pain, palpitations and leg swelling.  Endocrine: Negative for cold intolerance, heat intolerance, polydipsia, polyphagia and polyuria.  Genitourinary: Negative for dysuria, frequency and difficulty urinating.  Neurological: Negative for dizziness, light-headedness, numbness and headaches.    History Past Medical History  Diagnosis Date  . Hypertension   . Frequent headaches     He has past surgical history that includes Arthroscopic repair ACL (Left).   His family history includes Hypertension in his mother.He reports that he has never smoked. He has never used smokeless tobacco. He reports that he drinks alcohol. He reports that he does not use illicit drugs.  No current outpatient prescriptions on file prior to visit.   No current facility-administered medications on file prior to visit.     Objective:  Objective Physical Exam  Constitutional: He is oriented to person, place, and time. Vital signs are normal. He appears well-developed and well-nourished. He is sleeping.  HENT:  Head: Normocephalic and atraumatic.  Mouth/Throat: Oropharynx is clear and moist.  Eyes: EOM are normal. Pupils are equal, round, and reactive to light.  Neck: Normal range of motion. Neck supple. No thyromegaly present.  Cardiovascular: Normal rate and regular rhythm.   No murmur heard. Pulmonary/Chest: Effort normal and breath sounds normal. No respiratory distress. He has no wheezes. He  has no rales. He exhibits no tenderness.  Musculoskeletal: He exhibits no edema or tenderness.  Neurological: He is alert and oriented to person, place, and time.  Skin: Skin is warm and dry.  Psychiatric: He has a normal mood and affect. His behavior is normal. Judgment and thought content normal.   BP 120/76 mmHg  Pulse 66  Temp(Src) 99 F (37.2 C) (Oral)  Ht 5' 10" (1.778 m)  Wt 216 lb 9.6 oz (98.249 kg)  BMI 31.08 kg/m2  SpO2 97% Wt Readings from Last 3 Encounters:  10/28/15 216 lb 9.6 oz (98.249 kg)  04/22/15 213 lb 9.6 oz (96.888 kg)  10/01/14 215 lb 3.2 oz (97.614 kg)     Lab Results  Component Value Date   WBC 6.8 10/11/2013   HGB 14.4 10/11/2013   HCT 42.7 10/11/2013   PLT 238.0 10/11/2013   GLUCOSE 64* 04/22/2015   CHOL 127 04/22/2015   TRIG 142.0 04/22/2015   HDL 26.00* 04/22/2015   LDLCALC 73 04/22/2015   ALT 21 04/22/2015   AST 19 04/22/2015   NA 140 04/22/2015   K 4.0 04/22/2015   CL 102 04/22/2015   CREATININE 1.32 04/22/2015   BUN 9 04/22/2015   CO2 30 04/22/2015   TSH 0.97 10/11/2013   MICROALBUR 0.4 10/11/2013    Dg Forearm Right  09/14/2014  CLINICAL DATA:  Pain and swelling of the forearm since a fall 10 days ago while playing basketball. EXAM: RIGHT FOREARM - 2 VIEW COMPARISON:  None. FINDINGS: There is a nondisplaced transverse fracture of shaft of the left ulna with minimal angulation. The fracture is at the junction of the proximal 2/3 and the distal  1/3 of the ulna. There is what is probably an accessory ossification of the tip of the ulnar styloid. IMPRESSION: Nondisplaced fracture of the distal ulna shaft. Electronically Signed   By: Rozetta Nunnery M.D.   On: 09/14/2014 10:59   Dg Wrist Complete Right  09/14/2014  CLINICAL DATA:  Pain secondary to a fall playing basketball 10 days ago. EXAM: RIGHT WRIST - COMPLETE 3+ VIEW COMPARISON:  Forearm radiographs dated 09/14/2014 FINDINGS: There is no fracture or dislocation at the wrist. There is an  accessory ossification adjacent to the ulnar styloid. On the lateral view the fracture of the distal ulnar shaft is incompletely visualized. IMPRESSION: Normal right wrist.  Fracture of the distal ulnar shaft. Electronically Signed   By: Rozetta Nunnery M.D.   On: 09/14/2014 11:02     Assessment & Plan:  Plan I am having Mr. Bollman maintain his amLODipine and metoprolol tartrate.  Meds ordered this encounter  Medications  . DISCONTD: amLODipine (NORVASC) 5 MG tablet    Sig: Take 1 tablet (5 mg total) by mouth daily.    Dispense:  30 tablet    Refill:  5  . DISCONTD: metoprolol tartrate (LOPRESSOR) 25 MG tablet    Sig: 1 po bid    Dispense:  180 tablet    Refill:  3  . amLODipine (NORVASC) 5 MG tablet    Sig: Take 1 tablet (5 mg total) by mouth daily.    Dispense:  30 tablet    Refill:  5  . metoprolol tartrate (LOPRESSOR) 25 MG tablet    Sig: 1 po bid    Dispense:  180 tablet    Refill:  3    Problem List Items Addressed This Visit      Unprioritized   HTN (hypertension) - Primary    Its been running high at home-- he's been having headaches Restart meds       Relevant Medications   amLODipine (NORVASC) 5 MG tablet   metoprolol tartrate (LOPRESSOR) 25 MG tablet   Other Relevant Orders   Comp Met (CMET)   Lipid panel    Other Visit Diagnoses    Hyperlipemia        Relevant Medications    amLODipine (NORVASC) 5 MG tablet    metoprolol tartrate (LOPRESSOR) 25 MG tablet    Other Relevant Orders    Comp Met (CMET)    Lipid panel       Follow-up: Return in about 6 months (around 04/26/2016), or if symptoms worsen or fail to improve, for hypertension.  Garnet Koyanagi, DO

## 2015-10-28 NOTE — Assessment & Plan Note (Signed)
Its been running high at home-- he's been having headaches Restart meds

## 2015-10-28 NOTE — Progress Notes (Signed)
Pre visit review using our clinic review tool, if applicable. No additional management support is needed unless otherwise documented below in the visit note. 

## 2015-10-29 LAB — COMPREHENSIVE METABOLIC PANEL
ALBUMIN: 4.2 g/dL (ref 3.5–5.2)
ALK PHOS: 93 U/L (ref 39–117)
ALT: 20 U/L (ref 0–53)
AST: 21 U/L (ref 0–37)
BUN: 11 mg/dL (ref 6–23)
CHLORIDE: 105 meq/L (ref 96–112)
CO2: 29 mEq/L (ref 19–32)
CREATININE: 1.15 mg/dL (ref 0.40–1.50)
Calcium: 9.3 mg/dL (ref 8.4–10.5)
GFR: 93.21 mL/min (ref >60.00)
Glucose, Bld: 81 mg/dL (ref 70–99)
POTASSIUM: 4.2 meq/L (ref 3.5–5.1)
SODIUM: 140 meq/L (ref 135–145)
TOTAL PROTEIN: 7.6 g/dL (ref 6.0–8.3)
Total Bilirubin: 0.3 mg/dL (ref 0.2–1.2)

## 2015-10-29 LAB — LIPID PANEL
Cholesterol: 150 mg/dL (ref 0–200)
HDL: 26.1 mg/dL — AB (ref >39.00)
NONHDL: 123.41
TRIGLYCERIDES: 316 mg/dL — AB (ref 0.0–149.0)
Total CHOL/HDL Ratio: 6
VLDL: 63.2 mg/dL — ABNORMAL HIGH (ref 0.0–40.0)

## 2015-10-29 LAB — LDL CHOLESTEROL, DIRECT: LDL DIRECT: 92 mg/dL

## 2015-10-30 NOTE — Addendum Note (Signed)
Addended by: Erin Hearing on: 10/30/2015 04:40 PM   Modules accepted: Orders

## 2016-01-06 ENCOUNTER — Other Ambulatory Visit: Payer: BLUE CROSS/BLUE SHIELD

## 2016-01-27 ENCOUNTER — Other Ambulatory Visit: Payer: Self-pay

## 2016-01-27 DIAGNOSIS — I10 Essential (primary) hypertension: Secondary | ICD-10-CM

## 2016-01-27 MED ORDER — AMLODIPINE BESYLATE 5 MG PO TABS: 5.0000 mg | ORAL_TABLET | Freq: Every day | ORAL | Status: DC

## 2016-01-27 MED ORDER — METOPROLOL TARTRATE 25 MG PO TABS: ORAL_TABLET | ORAL | Status: DC

## 2016-04-27 ENCOUNTER — Encounter: Payer: BLUE CROSS/BLUE SHIELD | Admitting: Family Medicine

## 2016-04-27 DIAGNOSIS — Z0289 Encounter for other administrative examinations: Secondary | ICD-10-CM

## 2016-06-16 ENCOUNTER — Other Ambulatory Visit: Payer: Self-pay

## 2016-06-16 DIAGNOSIS — I10 Essential (primary) hypertension: Secondary | ICD-10-CM

## 2016-06-16 MED ORDER — AMLODIPINE BESYLATE 5 MG PO TABS: 5.0000 mg | ORAL_TABLET | Freq: Every day | ORAL | 3 refills | Status: DC

## 2016-06-16 MED ORDER — METOPROLOL TARTRATE 25 MG PO TABS: ORAL_TABLET | ORAL | 3 refills | Status: DC

## 2016-06-16 NOTE — Telephone Encounter (Signed)
Medication faxed      KP 

## 2016-07-24 ENCOUNTER — Encounter: Payer: Self-pay | Admitting: Physician Assistant

## 2016-07-24 ENCOUNTER — Ambulatory Visit (INDEPENDENT_AMBULATORY_CARE_PROVIDER_SITE_OTHER): Payer: BLUE CROSS/BLUE SHIELD | Admitting: Physician Assistant

## 2016-07-24 ENCOUNTER — Ambulatory Visit: Payer: BLUE CROSS/BLUE SHIELD | Admitting: Family Medicine

## 2016-07-24 VITALS — BP 116/80 | HR 67 | Temp 97.8°F | Resp 16 | Ht 70.0 in | Wt 215.4 lb

## 2016-07-24 DIAGNOSIS — R519 Headache, unspecified: Secondary | ICD-10-CM

## 2016-07-24 DIAGNOSIS — I10 Essential (primary) hypertension: Secondary | ICD-10-CM

## 2016-07-24 DIAGNOSIS — R51 Headache: Secondary | ICD-10-CM | POA: Diagnosis not present

## 2016-07-24 MED ORDER — NAPROXEN 500 MG PO TABS: 500.0000 mg | ORAL_TABLET | Freq: Two times a day (BID) | ORAL | 0 refills | Status: DC

## 2016-07-24 NOTE — Patient Instructions (Signed)
Please make sure to stay well hydrated.  Do not skip meals. I would recommend not pulling hair so tight as the tension can cause headaches.   Please take Naproxen as directed when needed for headaches. Do not take Goody Powders or BC powders.  Take Amlodipine every morning.  Take the Metoprolol without fail 8 Am and 7:30-8 PM to prevent rebound hypertension.  Continue checking BP measurements and write down.  Follow-up with Dr. Laury AxonLowne in 1 week.

## 2016-07-24 NOTE — Progress Notes (Signed)
Patient with history of frequent unspecified headaches and hypertension presents to clinic today complaining of some elevated BP measurements at home over the past week associated with mild headache. Patient is currently on regimen of Lopressor 25 mg BID and Amlodipine 5 mg daily. Endorses taking medications as directed overall but is not consistent with timing/taking of second metoprolol. Endorses averaging one mild headache per week, sometimes after episodes of skipping meals. Denies nausea, vomiting, fever, chills. Denies sinus pressure, sinus pain, ear pain. Denies vision changes. Denies neck pain or decreased ROM of neck. Notes some tension in posterior scalp when he pulls hair into a pony tail. Patient notes sister is an Charity fundraiserN who checks BP from time to time. Has notes some elevations over the past week so was encouraged by her to be seen. Does not have a log of BP measurements and cannot remember the values. Patient denies chest pain, palpitations, lightheadedness, dizziness, vision changes or frequent headaches.   Past Medical History:  Diagnosis Date  . Frequent headaches   . Hypertension     Current Outpatient Prescriptions on File Prior to Visit  Medication Sig Dispense Refill  . amLODipine (NORVASC) 5 MG tablet Take 1 tablet (5 mg total) by mouth daily. 90 tablet 3  . metoprolol tartrate (LOPRESSOR) 25 MG tablet 1 po bid 180 tablet 3   No current facility-administered medications on file prior to visit.     Allergies  Allergen Reactions  . Other Shortness Of Breath and Rash    ALL SEAFOOD  . Shellfish Allergy Shortness Of Breath and Rash    Family History  Problem Relation Age of Onset  . Hypertension Mother     Social History   Social History  . Marital status: Single    Spouse name: N/A  . Number of children: N/A  . Years of education: N/A   Occupational History  .  [employer]   Social History Main Topics  . Smoking status: Never  Smoker  . Smokeless tobacco: Never Used  . Alcohol use Yes     Comment: Occ  . Drug use: No  . Sexual activity: Yes    Partners: Female    Birth control/ protection: Condom   Other Topics Concern  . None   Social History Narrative   Exercise-- occassional   Review of Systems - See HPI.  All other ROS are negative.  BP 116/80 (BP Location: Left Arm, Patient Position: Sitting, Cuff Size: Large)   Pulse 67   Temp 97.8 F (36.6 C) (Oral)   Resp 16   Ht 5\' 10"  (1.778 m)   Wt 215 lb 6 oz (97.7 kg)   SpO2 98%   BMI 30.90 kg/m   Physical Exam  Constitutional: He is oriented to person, place, and time and well-developed, well-nourished, and in no distress.  HENT:  Head: Normocephalic and atraumatic.  Eyes: Conjunctivae and EOM are normal. Pupils are equal, round, and reactive to light.  Some photophobia endorsed during examination.  Neck: Normal range of motion. Neck supple. No muscular tenderness present.  Hair is in dred-locks and pulled into a tight pony tail. Notes pain is located in the same region.   Cardiovascular: Normal rate, regular rhythm, normal heart sounds and intact distal pulses.   Pulmonary/Chest: Effort normal and breath sounds normal. No respiratory distress. He has no wheezes. He has no rales. He exhibits no tenderness.  Neurological: He is alert and oriented to person, place,  and time.  Skin: Skin is warm and dry. No rash noted.  Psychiatric: Affect normal.  Vitals reviewed.  Assessment/Plan: 1. Essential hypertension BP normotensive today. Patient endorses some elevated BP measurements but does not have written list and can not remember values. No alarm symptoms. No change to BP meds today. He is to check BP each AM after medication and each evening and record. Bring measurements to FU with PCP in 1 week.  2. Generalized headaches One per week. Not convinced related to hypertension as seems well controlled here. He is skipping meals from time to time which  is a potential trigger. Discussed adequate diet and hydration. Patient also with very heavy dread-locks pulled tight. Some tenderness noted in the area. Discussed avoidance of pulling hair so tight as the weight is contributing to headaches.   Piedad Climes, PA-C

## 2018-06-07 ENCOUNTER — Ambulatory Visit (INDEPENDENT_AMBULATORY_CARE_PROVIDER_SITE_OTHER): Payer: BLUE CROSS/BLUE SHIELD | Admitting: Family Medicine

## 2018-06-07 ENCOUNTER — Encounter: Payer: Self-pay | Admitting: Family Medicine

## 2018-06-07 VITALS — BP 152/92 | HR 50 | Temp 98.5°F | Resp 16 | Ht 70.0 in | Wt 220.4 lb

## 2018-06-07 DIAGNOSIS — E785 Hyperlipidemia, unspecified: Secondary | ICD-10-CM

## 2018-06-07 DIAGNOSIS — I10 Essential (primary) hypertension: Secondary | ICD-10-CM | POA: Diagnosis not present

## 2018-06-07 LAB — LIPID PANEL
Cholesterol: 132 mg/dL (ref 0–200)
HDL: 27.5 mg/dL — ABNORMAL LOW (ref >39.00)
LDL CALC: 67 mg/dL (ref 0–99)
NonHDL: 104.36
Total CHOL/HDL Ratio: 5
Triglycerides: 185 mg/dL — ABNORMAL HIGH (ref 0.0–149.0)
VLDL: 37 mg/dL (ref 0.0–40.0)

## 2018-06-07 LAB — COMPREHENSIVE METABOLIC PANEL
ALT: 20 U/L (ref 0–53)
AST: 20 U/L (ref 0–37)
Albumin: 4.3 g/dL (ref 3.5–5.2)
Alkaline Phosphatase: 83 U/L (ref 39–117)
BILIRUBIN TOTAL: 0.6 mg/dL (ref 0.2–1.2)
BUN: 10 mg/dL (ref 6–23)
CALCIUM: 9.7 mg/dL (ref 8.4–10.5)
CO2: 27 mEq/L (ref 19–32)
CREATININE: 1.2 mg/dL (ref 0.40–1.50)
Chloride: 104 mEq/L (ref 96–112)
GFR: 87.45 mL/min (ref >60.00)
Glucose, Bld: 95 mg/dL (ref 70–99)
Potassium: 4.1 mEq/L (ref 3.5–5.1)
Sodium: 140 mEq/L (ref 135–145)
Total Protein: 7.3 g/dL (ref 6.0–8.3)

## 2018-06-07 MED ORDER — AMLODIPINE BESYLATE 5 MG PO TABS: 5.0000 mg | ORAL_TABLET | Freq: Every day | ORAL | 3 refills | Status: DC

## 2018-06-07 NOTE — Progress Notes (Signed)
Patient ID: Thomas King, male    DOB: 02-22-1981  Age: 37 y.o. MRN: [id number]    Subjective:  Subjective  HPI Thomas King presents for f/u bp and cholesterol     He still gets headaches on occasion and is not taking his bp meds.  No chest pain or sob.  Review of Systems  Constitutional: Negative for appetite change, diaphoresis, fatigue and unexpected weight change.  Eyes: Negative for pain, redness and visual disturbance.  Respiratory: Negative for cough, chest tightness, shortness of breath and wheezing.   Cardiovascular: Negative for chest pain, palpitations and leg swelling.  Endocrine: Negative for cold intolerance, heat intolerance, polydipsia, polyphagia and polyuria.  Genitourinary: Negative for difficulty urinating, dysuria and frequency.  Neurological: Positive for headaches. Negative for dizziness, light-headedness and numbness.    History Past Medical History:  Diagnosis Date  . Frequent headaches   . Hypertension     He has a past surgical history that includes Arthroscopic repair ACL (Left).   His family history includes Hypertension in his mother.He reports that he has never smoked. He has never used smokeless tobacco. He reports that he drinks alcohol. He reports that he does not use drugs.  Current Outpatient Medications on File Prior to Visit  Medication Sig Dispense Refill  . metoprolol tartrate (LOPRESSOR) 25 MG tablet 1 po bid (Patient not taking: Reported on 06/07/2018) 180 tablet 3   No current facility-administered medications on file prior to visit.      Objective:  Objective  Physical Exam  Constitutional: He is oriented to person, place, and time. Vital signs are normal. He appears well-developed and well-nourished. He is sleeping.  HENT:  Head: Normocephalic and atraumatic.  Mouth/Throat: Oropharynx is clear and moist.  Eyes: Pupils are equal, round, and reactive to light. EOM are normal.  Neck: Normal range of motion. Neck supple. No  thyromegaly present.  Cardiovascular: Normal rate and regular rhythm.  No murmur heard. Pulmonary/Chest: Effort normal and breath sounds normal. No respiratory distress. He has no wheezes. He has no rales. He exhibits no tenderness.  Musculoskeletal: He exhibits no edema or tenderness.  Neurological: He is alert and oriented to person, place, and time.  Skin: Skin is warm and dry.  Psychiatric: He has a normal mood and affect. His behavior is normal. Judgment and thought content normal.  Nursing note and vitals reviewed.  BP (!) 152/92 (BP Location: Right Arm, Cuff Size: Large)   Pulse (!) 50   Temp 98.5 F (36.9 C) (Oral)   Resp 16   Ht 5\' 10"  (1.778 m)   Wt 220 lb 6.4 oz (100 kg)   SpO2 99%   BMI 31.62 kg/m  Wt Readings from Last 3 Encounters:  06/07/18 220 lb 6.4 oz (100 kg)  07/24/16 215 lb 6 oz (97.7 kg)  10/28/15 216 lb 9.6 oz (98.2 kg)     Lab Results  Component Value Date   WBC 6.8 10/11/2013   HGB 14.4 10/11/2013   HCT 42.7 10/11/2013   PLT 238.0 10/11/2013   GLUCOSE 81 10/28/2015   CHOL 150 10/28/2015   TRIG 316.0 (H) 10/28/2015   HDL 26.10 (L) 10/28/2015   LDLDIRECT 92.0 10/28/2015   LDLCALC 73 04/22/2015   ALT 20 10/28/2015   AST 21 10/28/2015   NA 140 10/28/2015   K 4.2 10/28/2015   CL 105 10/28/2015   CREATININE 1.15 10/28/2015   BUN 11 10/28/2015   CO2 29 10/28/2015   TSH 0.97 10/11/2013  MICROALBUR 0.4 10/11/2013    Dg Forearm Right  Result Date: 09/14/2014 CLINICAL DATA:  Pain and swelling of the forearm since a fall 10 days ago while playing basketball. EXAM: RIGHT FOREARM - 2 VIEW COMPARISON:  None. FINDINGS: There is a nondisplaced transverse fracture of shaft of the left ulna with minimal angulation. The fracture is at the junction of the proximal 2/3 and the distal 1/3 of the ulna. There is what is probably an accessory ossification of the tip of the ulnar styloid. IMPRESSION: Nondisplaced fracture of the distal ulna shaft. Electronically  Signed   By: Geanie Cooley M.D.   On: 09/14/2014 10:59   Dg Wrist Complete Right  Result Date: 09/14/2014 CLINICAL DATA:  Pain secondary to a fall playing basketball 10 days ago. EXAM: RIGHT WRIST - COMPLETE 3+ VIEW COMPARISON:  Forearm radiographs dated 09/14/2014 FINDINGS: There is no fracture or dislocation at the wrist. There is an accessory ossification adjacent to the ulnar styloid. On the lateral view the fracture of the distal ulnar shaft is incompletely visualized. IMPRESSION: Normal right wrist.  Fracture of the distal ulnar shaft. Electronically Signed   By: Geanie Cooley M.D.   On: 09/14/2014 11:02     Assessment & Plan:  Plan  I have discontinued Thomas King's naproxen. I am also having him maintain his metoprolol tartrate and amLODipine.  Meds ordered this encounter  Medications  . amLODipine (NORVASC) 5 MG tablet    Sig: Take 1 tablet (5 mg total) by mouth daily.    Dispense:  90 tablet    Refill:  3    Problem List Items Addressed This Visit      Unprioritized   HTN (hypertension)    Poorly controlled will alter medications, encouraged DASH diet, minimize caffeine and obtain adequate sleep. Report concerning symptoms and follow up as directed and as needed Pt has not been on meds Restart norvasc --- recheck 2-3 weeks       Relevant Medications   amLODipine (NORVASC) 5 MG tablet   Hyperlipidemia    Encouraged heart healthy diet, increase exercise, avoid trans fats, consider a krill oil cap daily      Relevant Medications   amLODipine (NORVASC) 5 MG tablet    Other Visit Diagnoses    Hyperlipidemia LDL goal <100    -  Primary   Relevant Medications   amLODipine (NORVASC) 5 MG tablet   Other Relevant Orders   Comprehensive metabolic panel   Lipid panel      Follow-up: Return in about 2 weeks (around 06/21/2018), or if symptoms worsen or fail to improve, for bp check .  Thomas Schultz, DO

## 2018-06-07 NOTE — Patient Instructions (Signed)

## 2018-06-07 NOTE — Assessment & Plan Note (Addendum)
Poorly controlled will alter medications, encouraged DASH diet, minimize caffeine and obtain adequate sleep. Report concerning symptoms and follow up as directed and as needed Pt has not been on meds Restart norvasc --- recheck 2-3 weeks

## 2018-06-07 NOTE — Assessment & Plan Note (Signed)
Encouraged heart healthy diet, increase exercise, avoid trans fats, consider a krill oil cap daily 

## 2018-06-09 ENCOUNTER — Other Ambulatory Visit: Payer: Self-pay | Admitting: *Deleted

## 2018-06-09 ENCOUNTER — Encounter: Payer: Self-pay | Admitting: *Deleted

## 2018-06-09 DIAGNOSIS — I10 Essential (primary) hypertension: Secondary | ICD-10-CM

## 2018-06-09 DIAGNOSIS — E785 Hyperlipidemia, unspecified: Secondary | ICD-10-CM

## 2018-06-24 ENCOUNTER — Encounter: Payer: Self-pay | Admitting: Family Medicine

## 2018-06-24 ENCOUNTER — Ambulatory Visit (INDEPENDENT_AMBULATORY_CARE_PROVIDER_SITE_OTHER): Payer: BLUE CROSS/BLUE SHIELD | Admitting: Family Medicine

## 2018-06-24 DIAGNOSIS — I10 Essential (primary) hypertension: Secondary | ICD-10-CM | POA: Diagnosis not present

## 2018-06-24 NOTE — Assessment & Plan Note (Signed)
Well controlled, no changes to meds. Encouraged heart healthy diet such as the DASH diet and exercise as tolerated.  °

## 2018-06-24 NOTE — Progress Notes (Signed)
Patient ID: Thomas King, male    DOB: 02/18/1981  Age: 37 y.o. MRN: [id number]    Subjective:  Subjective  HPI Thomas King presents for f/u bp.  No complaints.        Review of Systems  Constitutional: Negative for appetite change, diaphoresis, fatigue and unexpected weight change.  Eyes: Negative for pain, redness and visual disturbance.  Respiratory: Negative for cough, chest tightness, shortness of breath and wheezing.   Cardiovascular: Negative for chest pain, palpitations and leg swelling.  Endocrine: Negative for cold intolerance, heat intolerance, polydipsia, polyphagia and polyuria.  Genitourinary: Negative for difficulty urinating, dysuria and frequency.  Neurological: Negative for dizziness, light-headedness, numbness and headaches.    History Past Medical History:  Diagnosis Date  . Frequent headaches   . Hypertension     He has a past surgical history that includes Arthroscopic repair ACL (Left).   His family history includes Hypertension in his mother.He reports that he has never smoked. He has never used smokeless tobacco. He reports that he drinks alcohol. He reports that he does not use drugs.  Current Outpatient Medications on File Prior to Visit  Medication Sig Dispense Refill  . amLODipine (NORVASC) 5 MG tablet Take 1 tablet (5 mg total) by mouth daily. 90 tablet 3   No current facility-administered medications on file prior to visit.      Objective:  Objective  Physical Exam  Constitutional: He is oriented to person, place, and time. Vital signs are normal. He appears well-developed and well-nourished. He is sleeping.  HENT:  Head: Normocephalic and atraumatic.  Mouth/Throat: Oropharynx is clear and moist.  Eyes: Pupils are equal, round, and reactive to light. EOM are normal.  Neck: Normal range of motion. Neck supple. No thyromegaly present.  Cardiovascular: Normal rate and regular rhythm.  No murmur heard. Pulmonary/Chest: Effort normal and  breath sounds normal. No respiratory distress. He has no wheezes. He has no rales. He exhibits no tenderness.  Musculoskeletal: He exhibits no edema or tenderness.  Neurological: He is alert and oriented to person, place, and time.  Skin: Skin is warm and dry.  Psychiatric: He has a normal mood and affect. His behavior is normal. Judgment and thought content normal.  Nursing note and vitals reviewed.  BP 117/67 (BP Location: Right Arm, Cuff Size: Normal)   Pulse (!) 52   Temp 98.7 F (37.1 C) (Oral)   Resp 16   Ht 5\' 10"  (1.778 m)   Wt 216 lb 9.6 oz (98.2 kg)   SpO2 99%   BMI 31.08 kg/m  Wt Readings from Last 3 Encounters:  06/24/18 216 lb 9.6 oz (98.2 kg)  06/07/18 220 lb 6.4 oz (100 kg)  07/24/16 215 lb 6 oz (97.7 kg)     Lab Results  Component Value Date   WBC 6.8 10/11/2013   HGB 14.4 10/11/2013   HCT 42.7 10/11/2013   PLT 238.0 10/11/2013   GLUCOSE 95 06/07/2018   CHOL 132 06/07/2018   TRIG 185.0 (H) 06/07/2018   HDL 27.50 (L) 06/07/2018   LDLDIRECT 92.0 10/28/2015   LDLCALC 67 06/07/2018   ALT 20 06/07/2018   AST 20 06/07/2018   NA 140 06/07/2018   K 4.1 06/07/2018   CL 104 06/07/2018   CREATININE 1.20 06/07/2018   BUN 10 06/07/2018   CO2 27 06/07/2018   TSH 0.97 10/11/2013   MICROALBUR 0.4 10/11/2013    Dg Forearm Right  Result Date: 09/14/2014 CLINICAL DATA:  Pain and swelling  of the forearm since a fall 10 days ago while playing basketball. EXAM: RIGHT FOREARM - 2 VIEW COMPARISON:  None. FINDINGS: There is a nondisplaced transverse fracture of shaft of the left ulna with minimal angulation. The fracture is at the junction of the proximal 2/3 and the distal 1/3 of the ulna. There is what is probably an accessory ossification of the tip of the ulnar styloid. IMPRESSION: Nondisplaced fracture of the distal ulna shaft. Electronically Signed   By: Geanie Cooley M.D.   On: 09/14/2014 10:59   Dg Wrist Complete Right  Result Date: 09/14/2014 CLINICAL DATA:   Pain secondary to a fall playing basketball 10 days ago. EXAM: RIGHT WRIST - COMPLETE 3+ VIEW COMPARISON:  Forearm radiographs dated 09/14/2014 FINDINGS: There is no fracture or dislocation at the wrist. There is an accessory ossification adjacent to the ulnar styloid. On the lateral view the fracture of the distal ulnar shaft is incompletely visualized. IMPRESSION: Normal right wrist.  Fracture of the distal ulnar shaft. Electronically Signed   By: Geanie Cooley M.D.   On: 09/14/2014 11:02     Assessment & Plan:  Plan  I have discontinued Thomas King's metoprolol tartrate. I am also having him maintain his amLODipine.  No orders of the defined types were placed in this encounter.   Problem List Items Addressed This Visit      Unprioritized   HTN (hypertension)    Well controlled, no changes to meds. Encouraged heart healthy diet such as the DASH diet and exercise as tolerated.          Follow-up: Return in about 6 months (around 12/23/2018) for annual exam, fasting.  Donato Schultz, DO

## 2018-06-24 NOTE — Patient Instructions (Signed)
DASH Eating Plan DASH stands for "Dietary Approaches to Stop Hypertension." The DASH eating plan is a healthy eating plan that has been shown to reduce high blood pressure (hypertension). It may also reduce your risk for type 2 diabetes, heart disease, and stroke. The DASH eating plan may also help with weight loss. What are tips for following this plan? General guidelines  Avoid eating more than 2,300 mg (milligrams) of salt (sodium) a day. If you have hypertension, you may need to reduce your sodium intake to 1,500 mg a day.  Limit alcohol intake to no more than 1 drink a day for nonpregnant women and 2 drinks a day for men. One drink equals 12 oz of beer, 5 oz of wine, or 1 oz of hard liquor.  Work with your health care provider to maintain a healthy body weight or to lose weight. Ask what an ideal weight is for you.  Get at least 30 minutes of exercise that causes your heart to beat faster (aerobic exercise) most days of the week. Activities may include walking, swimming, or biking.  Work with your health care provider or diet and nutrition specialist (dietitian) to adjust your eating plan to your individual calorie needs. Reading food labels  Check food labels for the amount of sodium per serving. Choose foods with less than 5 percent of the Daily Value of sodium. Generally, foods with less than 300 mg of sodium per serving fit into this eating plan.  To find whole grains, look for the word "whole" as the first word in the ingredient list. Shopping  Buy products labeled as "low-sodium" or "no salt added."  Buy fresh foods. Avoid canned foods and premade or frozen meals. Cooking  Avoid adding salt when cooking. Use salt-free seasonings or herbs instead of table salt or sea salt. Check with your health care provider or pharmacist before using salt substitutes.  Do not fry foods. Cook foods using healthy methods such as baking, boiling, grilling, and broiling instead.  Cook with  heart-healthy oils, such as olive, canola, soybean, or sunflower oil. Meal planning   Eat a balanced diet that includes: ? 5 or more servings of fruits and vegetables each day. At each meal, try to fill half of your plate with fruits and vegetables. ? Up to 6-8 servings of whole grains each day. ? Less than 6 oz of lean meat, poultry, or fish each day. A 3-oz serving of meat is about the same size as a deck of cards. One egg equals 1 oz. ? 2 servings of low-fat dairy each day. ? A serving of nuts, seeds, or beans 5 times each week. ? Heart-healthy fats. Healthy fats called Omega-3 fatty acids are found in foods such as flaxseeds and coldwater fish, like sardines, salmon, and mackerel.  Limit how much you eat of the following: ? Canned or prepackaged foods. ? Food that is high in trans fat, such as fried foods. ? Food that is high in saturated fat, such as fatty meat. ? Sweets, desserts, sugary drinks, and other foods with added sugar. ? Full-fat dairy products.  Do not salt foods before eating.  Try to eat at least 2 vegetarian meals each week.  Eat more home-cooked food and less restaurant, buffet, and fast food.  When eating at a restaurant, ask that your food be prepared with less salt or no salt, if possible. What foods are recommended? The items listed may not be a complete list. Talk with your dietitian about what   dietary choices are best for you. Grains Whole-grain or whole-wheat bread. Whole-grain or whole-wheat pasta. Brown rice. Oatmeal. Quinoa. Bulgur. Whole-grain and low-sodium cereals. Pita bread. Low-fat, low-sodium crackers. Whole-wheat flour tortillas. Vegetables Fresh or frozen vegetables (raw, steamed, roasted, or grilled). Low-sodium or reduced-sodium tomato and vegetable juice. Low-sodium or reduced-sodium tomato sauce and tomato paste. Low-sodium or reduced-sodium canned vegetables. Fruits All fresh, dried, or frozen fruit. Canned fruit in natural juice (without  added sugar). Meat and other protein foods Skinless chicken or turkey. Ground chicken or turkey. Pork with fat trimmed off. Fish and seafood. Egg whites. Dried beans, peas, or lentils. Unsalted nuts, nut butters, and seeds. Unsalted canned beans. Lean cuts of beef with fat trimmed off. Low-sodium, lean deli meat. Dairy Low-fat (1%) or fat-free (skim) milk. Fat-free, low-fat, or reduced-fat cheeses. Nonfat, low-sodium ricotta or cottage cheese. Low-fat or nonfat yogurt. Low-fat, low-sodium cheese. Fats and oils Soft margarine without trans fats. Vegetable oil. Low-fat, reduced-fat, or light mayonnaise and salad dressings (reduced-sodium). Canola, safflower, olive, soybean, and sunflower oils. Avocado. Seasoning and other foods Herbs. Spices. Seasoning mixes without salt. Unsalted popcorn and pretzels. Fat-free sweets. What foods are not recommended? The items listed may not be a complete list. Talk with your dietitian about what dietary choices are best for you. Grains Baked goods made with fat, such as croissants, muffins, or some breads. Dry pasta or rice meal packs. Vegetables Creamed or fried vegetables. Vegetables in a cheese sauce. Regular canned vegetables (not low-sodium or reduced-sodium). Regular canned tomato sauce and paste (not low-sodium or reduced-sodium). Regular tomato and vegetable juice (not low-sodium or reduced-sodium). Pickles. Olives. Fruits Canned fruit in a light or heavy syrup. Fried fruit. Fruit in cream or butter sauce. Meat and other protein foods Fatty cuts of meat. Ribs. Fried meat. Bacon. Sausage. Bologna and other processed lunch meats. Salami. Fatback. Hotdogs. Bratwurst. Salted nuts and seeds. Canned beans with added salt. Canned or smoked fish. Whole eggs or egg yolks. Chicken or turkey with skin. Dairy Whole or 2% milk, cream, and half-and-half. Whole or full-fat cream cheese. Whole-fat or sweetened yogurt. Full-fat cheese. Nondairy creamers. Whipped toppings.  Processed cheese and cheese spreads. Fats and oils Butter. Stick margarine. Lard. Shortening. Ghee. Bacon fat. Tropical oils, such as coconut, palm kernel, or palm oil. Seasoning and other foods Salted popcorn and pretzels. Onion salt, garlic salt, seasoned salt, table salt, and sea salt. Worcestershire sauce. Tartar sauce. Barbecue sauce. Teriyaki sauce. Soy sauce, including reduced-sodium. Steak sauce. Canned and packaged gravies. Fish sauce. Oyster sauce. Cocktail sauce. Horseradish that you find on the shelf. Ketchup. Mustard. Meat flavorings and tenderizers. Bouillon cubes. Hot sauce and Tabasco sauce. Premade or packaged marinades. Premade or packaged taco seasonings. Relishes. Regular salad dressings. Where to find more information:  National Heart, Lung, and Blood Institute: www.nhlbi.nih.gov  American Heart Association: www.heart.org Summary  The DASH eating plan is a healthy eating plan that has been shown to reduce high blood pressure (hypertension). It may also reduce your risk for type 2 diabetes, heart disease, and stroke.  With the DASH eating plan, you should limit salt (sodium) intake to 2,300 mg a day. If you have hypertension, you may need to reduce your sodium intake to 1,500 mg a day.  When on the DASH eating plan, aim to eat more fresh fruits and vegetables, whole grains, lean proteins, low-fat dairy, and heart-healthy fats.  Work with your health care provider or diet and nutrition specialist (dietitian) to adjust your eating plan to your individual   calorie needs. This information is not intended to replace advice given to you by your health care provider. Make sure you discuss any questions you have with your health care provider. Document Released: 09/10/2011 Document Revised: 09/14/2016 Document Reviewed: 09/14/2016 Elsevier Interactive Patient Education  2018 Elsevier Inc.  

## 2018-07-28 ENCOUNTER — Ambulatory Visit (INDEPENDENT_AMBULATORY_CARE_PROVIDER_SITE_OTHER): Payer: BLUE CROSS/BLUE SHIELD | Admitting: Family Medicine

## 2018-07-28 ENCOUNTER — Encounter: Payer: Self-pay | Admitting: Family Medicine

## 2018-07-28 ENCOUNTER — Other Ambulatory Visit (HOSPITAL_COMMUNITY)
Admission: RE | Admit: 2018-07-28 | Discharge: 2018-07-28 | Disposition: A | Discharge status: Home / Self Care | Payer: BLUE CROSS/BLUE SHIELD | Source: Ambulatory Visit | Attending: Family Medicine | Admitting: Family Medicine

## 2018-07-28 VITALS — BP 110/78 | HR 62 | Temp 98.5°F | Resp 16 | Ht 70.0 in | Wt 221.6 lb

## 2018-07-28 DIAGNOSIS — Z113 Encounter for screening for infections with a predominantly sexual mode of transmission: Secondary | ICD-10-CM | POA: Diagnosis not present

## 2018-07-28 DIAGNOSIS — Z0000 Encounter for general adult medical examination without abnormal findings: Secondary | ICD-10-CM | POA: Diagnosis not present

## 2018-07-28 DIAGNOSIS — M25562 Pain in left knee: Secondary | ICD-10-CM

## 2018-07-28 DIAGNOSIS — E785 Hyperlipidemia, unspecified: Secondary | ICD-10-CM

## 2018-07-28 DIAGNOSIS — I10 Essential (primary) hypertension: Secondary | ICD-10-CM

## 2018-07-28 DIAGNOSIS — G8929 Other chronic pain: Secondary | ICD-10-CM

## 2018-07-28 DIAGNOSIS — Z029 Encounter for administrative examinations, unspecified: Secondary | ICD-10-CM | POA: Insufficient documentation

## 2018-07-28 NOTE — Patient Instructions (Signed)
Preventive Care 18-39 Years, Male Preventive care refers to lifestyle choices and visits with your health care provider that can promote health and wellness. What does preventive care include?  A yearly physical exam. This is also called an annual well check.  Dental exams once or twice a year.  Routine eye exams. Ask your health care provider how often you should have your eyes checked.  Personal lifestyle choices, including: ? Daily care of your teeth and gums. ? Regular physical activity. ? Eating a healthy diet. ? Avoiding tobacco and drug use. ? Limiting alcohol use. ? Practicing safe sex. What happens during an annual well check? The services and screenings done by your health care provider during your annual well check will depend on your age, overall health, lifestyle risk factors, and family history of disease. Counseling Your health care provider may ask you questions about your:  Alcohol use.  Tobacco use.  Drug use.  Emotional well-being.  Home and relationship well-being.  Sexual activity.  Eating habits.  Work and work Statistician.  Screening You may have the following tests or measurements:  Height, weight, and BMI.  Blood pressure.  Lipid and cholesterol levels. These may be checked every 5 years starting at age 34.  Diabetes screening. This is done by checking your blood sugar (glucose) after you have not eaten for a while (fasting).  Skin check.  Hepatitis C blood test.  Hepatitis B blood test.  Sexually transmitted disease (STD) testing.  Discuss your test results, treatment options, and if necessary, the need for more tests with your health care provider. Vaccines Your health care provider may recommend certain vaccines, such as:  Influenza vaccine. This is recommended every year.  Tetanus, diphtheria, and acellular pertussis (Tdap, Td) vaccine. You may need a Td booster every 10 years.  Varicella vaccine. You may need this if you  have not been vaccinated.  HPV vaccine. If you are 23 or younger, you may need three doses over 6 months.  Measles, mumps, and rubella (MMR) vaccine. You may need at least one dose of MMR.You may also need a second dose.  Pneumococcal 13-valent conjugate (PCV13) vaccine. You may need this if you have certain conditions and have not been vaccinated.  Pneumococcal polysaccharide (PPSV23) vaccine. You may need one or two doses if you smoke cigarettes or if you have certain conditions.  Meningococcal vaccine. One dose is recommended if you are age 65-21 years and a first-year college student living in a residence hall, or if you have one of several medical conditions. You may also need additional booster doses.  Hepatitis A vaccine. You may need this if you have certain conditions or if you travel or work in places where you may be exposed to hepatitis A.  Hepatitis B vaccine. You may need this if you have certain conditions or if you travel or work in places where you may be exposed to hepatitis B.  Haemophilus influenzae type b (Hib) vaccine. You may need this if you have certain risk factors.  Talk to your health care provider about which screenings and vaccines you need and how often you need them. This information is not intended to replace advice given to you by your health care provider. Make sure you discuss any questions you have with your health care provider. Document Released: 11/17/2001 Document Revised: 06/10/2016 Document Reviewed: 07/23/2015 Elsevier Interactive Patient Education  [employer].

## 2018-07-28 NOTE — Assessment & Plan Note (Signed)
ghm utd Check labs See AVS 

## 2018-07-28 NOTE — Assessment & Plan Note (Signed)
Well controlled, no changes to meds. Encouraged heart healthy diet such as the DASH diet and exercise as tolerated.  °

## 2018-07-28 NOTE — Assessment & Plan Note (Addendum)
Lab Results  Component Value Date   CHOL 132 06/07/2018   HDL 27.50 (L) 06/07/2018   LDLCALC 67 06/07/2018   LDLDIRECT 92.0 10/28/2015   TRIG 185.0 (H) 06/07/2018   CHOLHDL 5 06/07/2018  Encouraged heart healthy diet, increase exercise, avoid trans fats, consider a krill oil cap daily

## 2018-07-28 NOTE — Progress Notes (Signed)
Patient ID: Thomas King, male    DOB: May 29, 1981  Age: 37 y.o. MRN: [id number]    Subjective:  Subjective  HPI Thomas King presents for cpe and labs   No complaints   Review of Systems  Constitutional: Negative.  Negative for fever.  HENT: Negative for congestion, ear pain, hearing loss, nosebleeds, postnasal drip, rhinorrhea, sinus pressure, sneezing and tinnitus.   Eyes: Negative for photophobia, discharge, itching and visual disturbance.  Respiratory: Negative.  Negative for shortness of breath.   Cardiovascular: Negative.  Negative for chest pain, palpitations and leg swelling.  Gastrointestinal: Negative for abdominal distention, abdominal pain, anal bleeding, blood in stool, constipation and nausea.  Endocrine: Negative.   Genitourinary: Negative.  Negative for dysuria and frequency.  Musculoskeletal: Negative.   Skin: Negative.  Negative for rash.  Allergic/Immunologic: Negative.  Negative for environmental allergies.  Neurological: Negative for dizziness, weakness, light-headedness, numbness and headaches.  Psychiatric/Behavioral: Negative for agitation, confusion, decreased concentration, dysphoric mood, sleep disturbance and suicidal ideas. The patient is not nervous/anxious.     History Past Medical History:  Diagnosis Date  . Frequent headaches   . Hypertension     He has a past surgical history that includes Arthroscopic repair ACL (Left).   His family history includes Hypertension in his mother.He reports that he has never smoked. He has never used smokeless tobacco. He reports that he drinks alcohol. He reports that he does not use drugs.  Current Outpatient Medications on File Prior to Visit  Medication Sig Dispense Refill  . amLODipine (NORVASC) 5 MG tablet Take 1 tablet (5 mg total) by mouth daily. 90 tablet 3   No current facility-administered medications on file prior to visit.      Objective:  Objective  Physical Exam  Constitutional: He is oriented  to person, place, and time. He appears well-developed and well-nourished. No distress.  HENT:  Head: Normocephalic and atraumatic.  Right Ear: External ear normal.  Left Ear: External ear normal.  Nose: Nose normal.  Mouth/Throat: Oropharynx is clear and moist. No oropharyngeal exudate.  Eyes: Pupils are equal, round, and reactive to light. Conjunctivae and EOM are normal. Right eye exhibits no discharge. Left eye exhibits no discharge.  Neck: Normal range of motion. Neck supple. No JVD present. No thyromegaly present.  Cardiovascular: Normal rate, regular rhythm, normal heart sounds and intact distal pulses.  No murmur heard. Pulmonary/Chest: Effort normal and breath sounds normal. No respiratory distress. He has no wheezes. He has no rales. He exhibits no tenderness.  Abdominal: Soft. Bowel sounds are normal. He exhibits no distension and no mass. There is no tenderness. There is no rebound and no guarding.  Musculoskeletal: Normal range of motion. He exhibits no edema.       Left knee: Tenderness found.       Legs: Lymphadenopathy:    He has no cervical adenopathy.  Neurological: He is alert and oriented to person, place, and time. He has normal reflexes. He displays normal reflexes. No cranial nerve deficit. He exhibits normal muscle tone.  Skin: Skin is warm and dry. No rash noted. He is not diaphoretic. No erythema.  Psychiatric: He has a normal mood and affect. His behavior is normal. Judgment and thought content normal.  Nursing note and vitals reviewed.  BP 110/78 (BP Location: Right Arm, Cuff Size: Normal)   Pulse 62   Temp 98.5 F (36.9 C) (Oral)   Resp 16   Ht 5\' 10"  (1.778 m)   Wt 221  lb 9.6 oz (100.5 kg)   SpO2 95%   BMI 31.80 kg/m  Wt Readings from Last 3 Encounters:  07/28/18 221 lb 9.6 oz (100.5 kg)  06/24/18 216 lb 9.6 oz (98.2 kg)  06/07/18 220 lb 6.4 oz (100 kg)     Lab Results  Component Value Date   WBC 6.8 10/11/2013   HGB 14.4 10/11/2013   HCT 42.7  10/11/2013   PLT 238.0 10/11/2013   GLUCOSE 95 06/07/2018   CHOL 132 06/07/2018   TRIG 185.0 (H) 06/07/2018   HDL 27.50 (L) 06/07/2018   LDLDIRECT 92.0 10/28/2015   LDLCALC 67 06/07/2018   ALT 20 06/07/2018   AST 20 06/07/2018   NA 140 06/07/2018   K 4.1 06/07/2018   CL 104 06/07/2018   CREATININE 1.20 06/07/2018   BUN 10 06/07/2018   CO2 27 06/07/2018   TSH 0.97 10/11/2013   MICROALBUR 0.4 10/11/2013    Dg Forearm Right  Result Date: 09/14/2014 CLINICAL DATA:  Pain and swelling of the forearm since a fall 10 days ago while playing basketball. EXAM: RIGHT FOREARM - 2 VIEW COMPARISON:  None. FINDINGS: There is a nondisplaced transverse fracture of shaft of the left ulna with minimal angulation. The fracture is at the junction of the proximal 2/3 and the distal 1/3 of the ulna. There is what is probably an accessory ossification of the tip of the ulnar styloid. IMPRESSION: Nondisplaced fracture of the distal ulna shaft. Electronically Signed   By: Geanie Cooley M.D.   On: 09/14/2014 10:59   Dg Wrist Complete Right  Result Date: 09/14/2014 CLINICAL DATA:  Pain secondary to a fall playing basketball 10 days ago. EXAM: RIGHT WRIST - COMPLETE 3+ VIEW COMPARISON:  Forearm radiographs dated 09/14/2014 FINDINGS: There is no fracture or dislocation at the wrist. There is an accessory ossification adjacent to the ulnar styloid. On the lateral view the fracture of the distal ulnar shaft is incompletely visualized. IMPRESSION: Normal right wrist.  Fracture of the distal ulnar shaft. Electronically Signed   By: Geanie Cooley M.D.   On: 09/14/2014 11:02     Assessment & Plan:  Plan  I am having Thomas King maintain his amLODipine.  No orders of the defined types were placed in this encounter.   Problem List Items Addressed This Visit      Unprioritized   HTN (hypertension)    Well controlled, no changes to meds. Encouraged heart healthy diet such as the DASH diet and exercise as  tolerated.       Hyperlipidemia    Lab Results  Component Value Date   CHOL 132 06/07/2018   HDL 27.50 (L) 06/07/2018   LDLCALC 67 06/07/2018   LDLDIRECT 92.0 10/28/2015   TRIG 185.0 (H) 06/07/2018   CHOLHDL 5 06/07/2018  Encouraged heart healthy diet, increase exercise, avoid trans fats, consider a krill oil cap daily       Left knee pain   Relevant Orders   Ambulatory referral to Orthopedic Surgery   Preventative health care - Primary    ghm utd Check labs  See AVS      Relevant Orders   CBC with Differential/Platelet   TSH    Other Visit Diagnoses    Screening for STD (sexually transmitted disease)       Relevant Orders   CBC with Differential/Platelet   TSH   HIV Antibody (routine testing w rflx)   HSV 1/2 Ab (IgM), IFA w/rflx Titer   RPR  Urine cytology ancillary only      Follow-up: Return in about 6 months (around 01/27/2019), or if symptoms worsen or fail to improve, for hypertension.  Donato Schultz, DO

## 2018-07-29 LAB — CBC WITH DIFFERENTIAL/PLATELET
BASOS ABS: 0.1 10*3/uL (ref 0.0–0.1)
Basophils Relative: 1.2 % (ref 0.0–3.0)
EOS ABS: 0.5 10*3/uL (ref 0.0–0.7)
Eosinophils Relative: 6.1 % — ABNORMAL HIGH (ref 0.0–5.0)
HEMATOCRIT: 46.2 % (ref 39.0–52.0)
Hemoglobin: 15.3 g/dL (ref 13.0–17.0)
LYMPHS ABS: 2.1 10*3/uL (ref 0.7–4.0)
LYMPHS PCT: 24.9 % (ref 12.0–46.0)
MCHC: 33.2 g/dL (ref 30.0–36.0)
MCV: 91 fl (ref 78.0–100.0)
Monocytes Absolute: 0.5 10*3/uL (ref 0.1–1.0)
Monocytes Relative: 6.4 % (ref 3.0–12.0)
NEUTROS PCT: 61.4 % (ref 43.0–77.0)
Neutro Abs: 5.1 10*3/uL (ref 1.4–7.7)
Platelets: 282 10*3/uL (ref 150.0–400.0)
RBC: 5.07 Mil/uL (ref 4.22–5.81)
RDW: 13.1 % (ref 11.5–15.5)
WBC: 8.3 10*3/uL (ref 4.0–10.5)

## 2018-07-29 LAB — TSH: TSH: 1.29 u[IU]/mL (ref 0.35–4.50)

## 2018-08-02 LAB — URINE CYTOLOGY ANCILLARY ONLY
Chlamydia: NEGATIVE
Neisseria Gonorrhea: NEGATIVE
TRICH (WINDOWPATH): NEGATIVE

## 2018-08-03 ENCOUNTER — Ambulatory Visit (INDEPENDENT_AMBULATORY_CARE_PROVIDER_SITE_OTHER): Payer: BLUE CROSS/BLUE SHIELD | Admitting: Orthopedic Surgery

## 2018-08-03 ENCOUNTER — Encounter (INDEPENDENT_AMBULATORY_CARE_PROVIDER_SITE_OTHER): Payer: Self-pay | Admitting: Orthopedic Surgery

## 2018-08-03 ENCOUNTER — Ambulatory Visit (INDEPENDENT_AMBULATORY_CARE_PROVIDER_SITE_OTHER): Payer: BLUE CROSS/BLUE SHIELD

## 2018-08-03 ENCOUNTER — Telehealth: Payer: Self-pay | Admitting: Family Medicine

## 2018-08-03 DIAGNOSIS — M25562 Pain in left knee: Secondary | ICD-10-CM | POA: Diagnosis not present

## 2018-08-03 DIAGNOSIS — M1712 Unilateral primary osteoarthritis, left knee: Secondary | ICD-10-CM

## 2018-08-03 DIAGNOSIS — G8929 Other chronic pain: Secondary | ICD-10-CM

## 2018-08-03 LAB — HIV ANTIBODY (ROUTINE TESTING W REFLEX): HIV 1&2 Ab, 4th Generation: NONREACTIVE

## 2018-08-03 LAB — HSV 1/2 AB (IGM), IFA W/RFLX TITER
HSV 1 IgM Screen: NEGATIVE
HSV 2 IgM Screen: NEGATIVE

## 2018-08-03 LAB — RPR: RPR: NONREACTIVE

## 2018-08-03 NOTE — Telephone Encounter (Signed)
Called patient back to explain that the amounts billed were applied towards his deductible.  Patient acknowledged understanding and stated he will call to set up a payment plan.

## 2018-08-03 NOTE — Telephone Encounter (Signed)
-----   Message from Donato Schultz, DO sent at 07/28/2018  2:54 PM EDT ----- Pt has questions about a bill

## 2018-08-05 ENCOUNTER — Encounter (INDEPENDENT_AMBULATORY_CARE_PROVIDER_SITE_OTHER): Payer: Self-pay | Admitting: Orthopedic Surgery

## 2018-08-05 DIAGNOSIS — M1712 Unilateral primary osteoarthritis, left knee: Secondary | ICD-10-CM | POA: Diagnosis not present

## 2018-08-05 MED ORDER — LIDOCAINE HCL 1 % IJ SOLN
5.0000 mL | INTRAMUSCULAR | Status: AC | PRN
  Administered 2018-08-05: 5 mL

## 2018-08-05 MED ORDER — METHYLPREDNISOLONE ACETATE 40 MG/ML IJ SUSP
40.0000 mg | INTRAMUSCULAR | Status: AC | PRN
  Administered 2018-08-05: 40 mg via INTRA_ARTICULAR

## 2018-08-05 MED ORDER — BUPIVACAINE HCL 0.25 % IJ SOLN
4.0000 mL | INTRAMUSCULAR | Status: AC | PRN
  Administered 2018-08-05: 4 mL via INTRA_ARTICULAR

## 2018-08-05 NOTE — Progress Notes (Signed)
Office Visit Note   Patient: Thomas King           Date of Birth: 06/27/1981           MRN: [id number] Visit Date: 08/03/2018 Requested by: [address], [place], [place] [address] [place], [place] [address] PCP: Zola Button, Grayling Congress, DO  Subjective: Chief Complaint  Patient presents with  . Left Knee - Pain    HPI: Thomas King is a patient with left knee pain.  He had an injection in 2012.  He has had 2 prior surgeries on that left knee which included arthroscopy while he was in [employer] [facility] as well as ACL reconstruction.  Describes pain weakness giving way.  Currently is working in a warehouse.  He is unable to play basketball.  He does report some degree of symptomatic instability.  Not currently taking any medication for the problem.  No personal or family history of DVT or pulmonary embolism.              ROS: All systems reviewed are negative as they relate to the chief complaint within the history of present illness.  Patient denies  fevers or chills.   Assessment & Plan: Visit Diagnoses:  1. Chronic pain of left knee   2. Unilateral primary osteoarthritis, left knee     Plan: Impression is left knee pain with evidence of arthritis in the lateral compartment and patellofemoral compartment.  I think there is likely some degree of graft elongation or failure at this time as well.  Difficult situation for [facility].  He has some loose bodies and a Baker's cyst posterior medially also.  Plan at this time is aspiration and injection of that left knee.  I think to tell if this is arthroscopically treatable or whether or not this is just significant arthritis we would need MRI scanning.  That may be an out-of-pocket expense that he does not want to pursue.  He will consider his options and let me know if he wants to consider MRI scanning of that left knee.  Follow-Up Instructions: Return if symptoms worsen or fail to improve.   Orders:  Orders Placed This Encounter  Procedures    . XR KNEE 3 VIEW LEFT   No orders of the defined types were placed in this encounter.     Procedures: Large Joint Inj: L knee on 08/05/2018 7:01 PM Indications: diagnostic evaluation, joint swelling and pain Details: 18 G 1.5 in needle, superolateral approach  Arthrogram: No  Medications: 5 mL lidocaine 1 %; 40 mg methylPREDNISolone acetate 40 MG/ML; 4 mL bupivacaine 0.25 % Outcome: tolerated well, no immediate complications Procedure, treatment alternatives, risks and benefits explained, specific risks discussed. Consent was given by the patient. Immediately prior to procedure a time out was called to verify the correct patient, procedure, equipment, support staff and site/side marked as required. Patient was prepped and draped in the usual sterile fashion.       Clinical Data: No additional findings.  Objective: Vital Signs: There were no vitals taken for this visit.  Physical Exam:   Constitutional: Patient appears well-developed HEENT:  Head: Normocephalic Eyes:EOM are normal Neck: Normal range of motion Cardiovascular: Normal rate Pulmonary/chest: Effort normal Neurologic: Patient is alert Skin: Skin is warm Psychiatric: Patient has normal mood and affect    Ortho Exam: Ortho exam demonstrates full active and passive range of motion of the right knee.  Left knee has well-healed surgical incisions from previous  ACL reconstruction.  Moderate arthritis is present.  Collaterals are stable.  No posterior lateral rotatory instability is noted.  ACL laxity is present on the left compared to the right.  No groin pain with internal and external rotation of the leg.  Range of motion is full and quad strength and hamstring strength is excellent on that left-hand side.  Flexion is only to about 110 on the left compared to 125 on the right  Specialty Comments:  No specialty comments available.  Imaging: No results found.   PMFS History: Patient Active Problem List    Diagnosis Date Noted  . Preventative health care 07/28/2018  . HTN (hypertension) 10/01/2014  . Hyperlipidemia 10/01/2014  . Ulna fracture 09/14/2014  . Left knee pain 10/11/2013  . Obesity (BMI 30-39.9) 07/29/2013   Past Medical History:  Diagnosis Date  . Frequent headaches   . Hypertension     Family History  Problem Relation Age of Onset  . Hypertension Mother     Past Surgical History:  Procedure Laterality Date  . 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 Smoking status: Never Smoker  . Smokeless tobacco: Never Used  Substance and Sexual Activity  . Alcohol use: Yes    Comment: Occ  . Drug use: No  . Sexual activity: Yes    Partners: Female    Birth control/protection: Condom

## 2018-08-08 ENCOUNTER — Telehealth: Payer: Self-pay | Admitting: *Deleted

## 2018-08-08 NOTE — Telephone Encounter (Signed)
Received Medical records from [facility]; forwarded to provider/SLS 11/04

## 2019-01-27 ENCOUNTER — Other Ambulatory Visit: Payer: Self-pay

## 2019-01-27 ENCOUNTER — Ambulatory Visit: Payer: Self-pay | Admitting: Family Medicine

## 2019-07-25 ENCOUNTER — Other Ambulatory Visit: Payer: Self-pay | Admitting: Family Medicine

## 2019-07-25 DIAGNOSIS — I10 Essential (primary) hypertension: Secondary | ICD-10-CM

## 2019-07-25 MED ORDER — AMLODIPINE BESYLATE 5 MG PO TABS: 5.0000 mg | ORAL_TABLET | Freq: Every day | ORAL | 0 refills | Status: DC

## 2019-07-25 NOTE — Telephone Encounter (Signed)
Medication Refill - Medication: amLODipine (NORVASC) 5 MG tablet  Pt is completely out of his medication and is requesting enough medication to get to his physical appt on 08/01/2019.   Preferred Pharmacy:  [facility], [place]. [phone] (Phone) [phone] (Fax)    Pt was advised that RX refills may take up to 3 business days. We ask that you follow-up with your pharmacy.

## 2019-07-31 ENCOUNTER — Other Ambulatory Visit: Payer: Self-pay

## 2019-08-01 ENCOUNTER — Encounter: Payer: Self-pay | Admitting: Family Medicine

## 2019-08-01 ENCOUNTER — Ambulatory Visit (INDEPENDENT_AMBULATORY_CARE_PROVIDER_SITE_OTHER): Payer: 59 | Admitting: Family Medicine

## 2019-08-01 VITALS — BP 122/80 | HR 61 | Temp 97.8°F | Resp 18 | Ht 70.0 in | Wt 229.2 lb

## 2019-08-01 DIAGNOSIS — Z0000 Encounter for general adult medical examination without abnormal findings: Secondary | ICD-10-CM | POA: Diagnosis not present

## 2019-08-01 DIAGNOSIS — E785 Hyperlipidemia, unspecified: Secondary | ICD-10-CM

## 2019-08-01 DIAGNOSIS — I10 Essential (primary) hypertension: Secondary | ICD-10-CM | POA: Diagnosis not present

## 2019-08-01 LAB — CBC WITH DIFFERENTIAL/PLATELET
Basophils Absolute: 0 10*3/uL (ref 0.0–0.1)
Basophils Relative: 0.6 % (ref 0.0–3.0)
Eosinophils Absolute: 0.5 10*3/uL (ref 0.0–0.7)
Eosinophils Relative: 7.8 % — ABNORMAL HIGH (ref 0.0–5.0)
HCT: 43.4 % (ref 39.0–52.0)
Hemoglobin: 14.5 g/dL (ref 13.0–17.0)
Lymphocytes Relative: 25.2 % (ref 12.0–46.0)
Lymphs Abs: 1.7 10*3/uL (ref 0.7–4.0)
MCHC: 33.5 g/dL (ref 30.0–36.0)
MCV: 89.3 fl (ref 78.0–100.0)
Monocytes Absolute: 0.5 10*3/uL (ref 0.1–1.0)
Monocytes Relative: 7 % (ref 3.0–12.0)
Neutro Abs: 4 10*3/uL (ref 1.4–7.7)
Neutrophils Relative %: 59.4 % (ref 43.0–77.0)
Platelets: 255 10*3/uL (ref 150.0–400.0)
RBC: 4.85 Mil/uL (ref 4.22–5.81)
RDW: 12.7 % (ref 11.5–15.5)
WBC: 6.7 10*3/uL (ref 4.0–10.5)

## 2019-08-01 LAB — LIPID PANEL
Cholesterol: 136 mg/dL (ref 0–200)
HDL: 24.6 mg/dL — ABNORMAL LOW (ref >39.00)
NonHDL: 111.85
Total CHOL/HDL Ratio: 6
Triglycerides: 225 mg/dL — ABNORMAL HIGH (ref 0.0–149.0)
VLDL: 45 mg/dL — ABNORMAL HIGH (ref 0.0–40.0)

## 2019-08-01 LAB — COMPREHENSIVE METABOLIC PANEL
ALT: 28 U/L (ref 0–53)
AST: 23 U/L (ref 0–37)
Albumin: 4.2 g/dL (ref 3.5–5.2)
Alkaline Phosphatase: 86 U/L (ref 39–117)
BUN: 8 mg/dL (ref 6–23)
CO2: 27 mEq/L (ref 19–32)
Calcium: 9.1 mg/dL (ref 8.4–10.5)
Chloride: 105 mEq/L (ref 96–112)
Creatinine, Ser: 1.05 mg/dL (ref 0.40–1.50)
GFR: 95.4 mL/min (ref >60.00)
Glucose, Bld: 102 mg/dL — ABNORMAL HIGH (ref 70–99)
Potassium: 3.9 mEq/L (ref 3.5–5.1)
Sodium: 139 mEq/L (ref 135–145)
Total Bilirubin: 0.5 mg/dL (ref 0.2–1.2)
Total Protein: 7 g/dL (ref 6.0–8.3)

## 2019-08-01 LAB — LDL CHOLESTEROL, DIRECT: Direct LDL: 67 mg/dL

## 2019-08-01 MED ORDER — AMLODIPINE BESYLATE 5 MG PO TABS: 5.0000 mg | ORAL_TABLET | Freq: Every day | ORAL | 3 refills | Status: DC

## 2019-08-01 NOTE — Assessment & Plan Note (Signed)
Well controlled, no changes to meds. Encouraged heart healthy diet such as the DASH diet and exercise as tolerated.  °

## 2019-08-01 NOTE — Assessment & Plan Note (Signed)
ghm utd Check labs See AVS Pt refused flu shot 

## 2019-08-01 NOTE — Patient Instructions (Signed)
Preventive Care 19-38 Years Old, Male Preventive care refers to lifestyle choices and visits with your health care provider that can promote health and wellness. This includes:  A yearly physical exam. This is also called an annual well check.  Regular dental and eye exams.  Immunizations.  Screening for certain conditions.  Healthy lifestyle choices, such as eating a healthy diet, getting regular exercise, not using drugs or products that contain nicotine and tobacco, and limiting alcohol use. What can I expect for my preventive care visit? Physical exam Your health care provider will check:  Height and weight. These may be used to calculate body mass index (BMI), which is a measurement that tells if you are at a healthy weight.  Heart rate and blood pressure.  Your skin for abnormal spots. Counseling Your health care provider may ask you questions about:  Alcohol, tobacco, and drug use.  Emotional well-being.  Home and relationship well-being.  Sexual activity.  Eating habits.  Work and work Statistician. What immunizations do I need?  Influenza (flu) vaccine  This is recommended every year. Tetanus, diphtheria, and pertussis (Tdap) vaccine  You may need a Td booster every 10 years. Varicella (chickenpox) vaccine  You may need this vaccine if you have not already been vaccinated. Human papillomavirus (HPV) vaccine  If recommended by your health care provider, you may need three doses over 6 months. Measles, mumps, and rubella (MMR) vaccine  You may need at least one dose of MMR. You may also need a second dose. Meningococcal conjugate (MenACWY) vaccine  One dose is recommended if you are 45-76 years old and a Market researcher living in a residence hall, or if you have one of several medical conditions. You may also need additional booster doses. Pneumococcal conjugate (PCV13) vaccine  You may need this if you have certain conditions and were not  previously vaccinated. Pneumococcal polysaccharide (PPSV23) vaccine  You may need one or two doses if you smoke cigarettes or if you have certain conditions. Hepatitis A vaccine  You may need this if you have certain conditions or if you travel or work in places where you may be exposed to hepatitis A. Hepatitis B vaccine  You may need this if you have certain conditions or if you travel or work in places where you may be exposed to hepatitis B. Haemophilus influenzae type b (Hib) vaccine  You may need this if you have certain risk factors. You may receive vaccines as individual doses or as more than one vaccine together in one shot (combination vaccines). Talk with your health care provider about the risks and benefits of combination vaccines. What tests do I need? Blood tests  Lipid and cholesterol levels. These may be checked every 5 years starting at age 17.  Hepatitis C test.  Hepatitis B test. Screening   Diabetes screening. This is done by checking your blood sugar (glucose) after you have not eaten for a while (fasting).  Sexually transmitted disease (STD) testing. Talk with your health care provider about your test results, treatment options, and if necessary, the need for more tests. Follow these instructions at home: Eating and drinking   Eat a diet that includes fresh fruits and vegetables, whole grains, lean protein, and low-fat dairy products.  Take vitamin and mineral supplements as recommended by your health care provider.  Do not drink alcohol if your health care provider tells you not to drink.  If you drink alcohol: ? Limit how much you have to 0-2  drinks a day. ? Be aware of how much alcohol is in your drink. In the [place] one drink equals one 12 oz bottle of beer (355 mL), one 5 oz glass of wine (148 mL), or one 1 oz glass of hard liquor (44 mL). Lifestyle  Take daily care of your teeth and gums.  Stay active. Exercise for at least 30 minutes on 5 or  more days each week.  Do not use any products that contain nicotine or tobacco, such as cigarettes, e-cigarettes, and chewing tobacco. If you need help quitting, ask your health care provider.  If you are sexually active, practice safe sex. Use a condom or other form of protection to prevent STIs (sexually transmitted infections). What's next?  Go to your health care provider once a year for a well check visit.  Ask your health care provider how often you should have your eyes and teeth checked.  Stay up to date on all vaccines. This information is not intended to replace advice given to you by your health care provider. Make sure you discuss any questions you have with your health care provider. Document Released: 11/17/2001 Document Revised: 09/15/2018 Document Reviewed: 09/15/2018 Elsevier Patient Education  2020 Elsevier Inc.  

## 2019-08-01 NOTE — Progress Notes (Signed)
Patient ID: Thomas King, male    DOB: 01/17/1981  Age: 38 y.o. MRN: [id number]    Subjective:  Subjective  HPI Thomas King presents for cpe.  No compliants  Review of Systems  Constitutional: Negative.  Negative for appetite change, diaphoresis, fatigue and unexpected weight change.  HENT: Negative for congestion, ear pain, hearing loss, nosebleeds, postnasal drip, rhinorrhea, sinus pressure, sneezing and tinnitus.   Eyes: Negative for photophobia, pain, discharge, redness, itching and visual disturbance.  Respiratory: Negative.  Negative for cough, chest tightness, shortness of breath and wheezing.   Cardiovascular: Negative.  Negative for chest pain, palpitations and leg swelling.  Gastrointestinal: Negative for abdominal distention, abdominal pain, anal bleeding, blood in stool and constipation.  Endocrine: Negative.  Negative for cold intolerance, heat intolerance, polydipsia, polyphagia and polyuria.  Genitourinary: Negative.  Negative for difficulty urinating, dysuria and frequency.  Musculoskeletal: Negative.   Skin: Negative.   Allergic/Immunologic: Negative.   Neurological: Negative for dizziness, weakness, light-headedness, numbness and headaches.  Psychiatric/Behavioral: Negative for agitation, confusion, decreased concentration, dysphoric mood, sleep disturbance and suicidal ideas. The patient is not nervous/anxious.     History Past Medical History:  Diagnosis Date   Frequent headaches    Hypertension     He has a past surgical history that includes Arthroscopic repair ACL (Left).   His family history includes Hypertension in his mother.He reports that he has never smoked. He has never used smokeless tobacco. He reports current alcohol use. He reports that he does not use drugs.  No current outpatient medications on file prior to visit.   No current facility-administered medications on file prior to visit.      Objective:  Objective  Physical Exam Vitals  signs and nursing note reviewed.  Constitutional:      General: He is not in acute distress.    Appearance: He is well-developed. He is not diaphoretic.  HENT:     Head: Normocephalic and atraumatic.     Right Ear: External ear normal.     Left Ear: External ear normal.     Nose: Nose normal.     Mouth/Throat:     Pharynx: No oropharyngeal exudate.  Eyes:     General:        Right eye: No discharge.        Left eye: No discharge.     Conjunctiva/sclera: Conjunctivae normal.     Pupils: Pupils are equal, round, and reactive to light.  Neck:     Musculoskeletal: Normal range of motion and neck supple.     Thyroid: No thyromegaly.     Vascular: No JVD.  Cardiovascular:     Rate and Rhythm: Normal rate and regular rhythm.     Heart sounds: No murmur. No friction rub. No gallop.   Pulmonary:     Effort: Pulmonary effort is normal. No respiratory distress.     Breath sounds: Normal breath sounds. No wheezing or rales.  Chest:     Chest wall: No tenderness.  Abdominal:     General: Bowel sounds are normal. There is no distension.     Palpations: Abdomen is soft. There is no mass.     Tenderness: There is no abdominal tenderness. There is no guarding or rebound.  Musculoskeletal: Normal range of motion.        General: No tenderness.  Lymphadenopathy:     Cervical: No cervical adenopathy.  Skin:    General: Skin is warm and dry.  Coloration: Skin is not pale.     Findings: No erythema or rash.  Neurological:     Mental Status: He is alert and oriented to person, place, and time.     Motor: No abnormal muscle tone.     Deep Tendon Reflexes: Reflexes are normal and symmetric. Reflexes normal.  Psychiatric:        Behavior: Behavior normal.        Thought Content: Thought content normal.        Judgment: Judgment normal.    BP 122/80 (BP Location: Right Arm, Patient Position: Sitting, Cuff Size: Normal)    Pulse 61    Temp 97.8 F (36.6 C) (Temporal)    Resp 18    Ht 5'  10" (1.778 m)    Wt 229 lb 3.2 oz (104 kg)    SpO2 97%    BMI 32.89 kg/m  Wt Readings from Last 3 Encounters:  08/01/19 229 lb 3.2 oz (104 kg)  07/28/18 221 lb 9.6 oz (100.5 kg)  06/24/18 216 lb 9.6 oz (98.2 kg)     Lab Results  Component Value Date   WBC 8.3 07/28/2018   HGB 15.3 07/28/2018   HCT 46.2 07/28/2018   PLT 282.0 07/28/2018   GLUCOSE 95 06/07/2018   CHOL 132 06/07/2018   TRIG 185.0 (H) 06/07/2018   HDL 27.50 (L) 06/07/2018   LDLDIRECT 92.0 10/28/2015   LDLCALC 67 06/07/2018   ALT 20 06/07/2018   AST 20 06/07/2018   NA 140 06/07/2018   K 4.1 06/07/2018   CL 104 06/07/2018   CREATININE 1.20 06/07/2018   BUN 10 06/07/2018   CO2 27 06/07/2018   TSH 1.29 07/28/2018   MICROALBUR 0.4 10/11/2013    No results found.   Assessment & Plan:  Plan  I am having Pawel C. Propst maintain his amLODipine.  Meds ordered this encounter  Medications   amLODipine (NORVASC) 5 MG tablet    Sig: Take 1 tablet (5 mg total) by mouth daily.    Dispense:  90 tablet    Refill:  3    Problem List Items Addressed This Visit      Unprioritized   HTN (hypertension)    Well controlled, no changes to meds. Encouraged heart healthy diet such as the DASH diet and exercise as tolerated.       Relevant Medications   amLODipine (NORVASC) 5 MG tablet   Hyperlipidemia    Encouraged heart healthy diet, increase exercise, avoid trans fats, consider a krill oil cap daily      Relevant Medications   amLODipine (NORVASC) 5 MG tablet   Preventative health care - Primary    ghm utd Check labs  See AVS Pt refused flu shot      Relevant Orders   TSH   Lipid panel   CBC with Differential/Platelet   Comprehensive metabolic panel      Follow-up: Return in about 6 months (around 01/30/2020), or if symptoms worsen or fail to improve, for hypertension.  Donato Schultz, DO

## 2019-08-01 NOTE — Assessment & Plan Note (Signed)
Encouraged heart healthy diet, increase exercise, avoid trans fats, consider a krill oil cap daily 

## 2019-08-03 LAB — TSH: TSH: 1.83 u[IU]/mL (ref 0.35–4.50)

## 2019-08-06 ENCOUNTER — Other Ambulatory Visit: Payer: Self-pay | Admitting: Family Medicine

## 2019-08-06 DIAGNOSIS — E785 Hyperlipidemia, unspecified: Secondary | ICD-10-CM

## 2019-08-07 ENCOUNTER — Telehealth: Payer: Self-pay | Admitting: *Deleted

## 2019-08-07 NOTE — Telephone Encounter (Signed)
-----   Message from Ann Held, DO sent at 08/06/2019 10:37 AM EST ----- Cholesterol--- LDL goal < 100,  HDL >40,  TG < 150.  Diet and exercise will increase HDL and decrease LDL and TG.  Fish,  Fish Oil, Flaxseed oil will also help increase the HDL and decrease Triglycerides.   Recheck labs in 3 months .

## 2019-08-07 NOTE — Telephone Encounter (Signed)
Patient notified. He will call to schedule lab appointment when he gets insurance again.

## 2019-08-07 NOTE — Telephone Encounter (Signed)
Advised patient of results and to try otc fish oil and flax seed oil.  He stated that he is allergic to seafood.  Are there any more recommendations of what he can take for the cholesterol?

## 2019-08-07 NOTE — Telephone Encounter (Signed)
Flaxseed oil

## 2020-01-29 ENCOUNTER — Ambulatory Visit: Payer: 59 | Admitting: Family Medicine

## 2020-01-29 DIAGNOSIS — Z0289 Encounter for other administrative examinations: Secondary | ICD-10-CM

## 2020-08-04 ENCOUNTER — Ambulatory Visit
Admission: EM | Admit: 2020-08-04 | Discharge: 2020-08-04 | Disposition: A | Discharge status: Home / Self Care | Payer: Managed Care, Other (non HMO)

## 2020-08-04 ENCOUNTER — Other Ambulatory Visit: Payer: Self-pay

## 2020-08-04 DIAGNOSIS — R519 Headache, unspecified: Secondary | ICD-10-CM

## 2020-08-04 NOTE — ED Provider Notes (Signed)
[facility]    CSN: [id number] Arrival date & time: 08/04/20  6195      History   Chief Complaint Chief Complaint  Patient presents with   Headache    x 2 days    HPI KHIRY PASQUARIELLO is a 39 y.o. male.   39 year old male comes in for 2 day history of headache. State had to leave work due to this. Denies current headache and is completely asymptomatic. Denies associated nausea/vomiting, vision changes, photophobia. Denies URI symptoms, fever. Sleeping normally. 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 Brother recently passed away from aneurysm and has PCP appointment for follow up.      Past Medical History:  Diagnosis Date   Frequent headaches    Hypertension     Patient Active Problem List   Diagnosis Date Noted   Preventative health care 07/28/2018   HTN (hypertension) 10/01/2014   Hyperlipidemia 10/01/2014   Ulna fracture 09/14/2014   Left knee pain 10/11/2013   Obesity (BMI 30-39.9) 07/29/2013    Past Surgical History:  Procedure Laterality Date   ARTHROSCOPIC REPAIR ACL Left    x's 2        Home Medications    Prior to Admission medications   Medication Sig Start Date End Date Taking? Authorizing Provider  amLODipine (NORVASC) 5 MG tablet Take 1 tablet (5 mg total) by mouth daily. 08/01/19  Yes Donato Schultz, DO    Family History Family History  Problem Relation Age of Onset   Hypertension Mother     Social History Social History   Tobacco Use   Smoking status: Never Smoker   Smokeless tobacco: Never Used  Building services engineer Use: Never used  Substance Use Topics   Alcohol use: Yes    Comment: Occ   Drug use: No     Allergies   Other and Shellfish allergy   Review of Systems Review of Systems  Reason unable to perform ROS: See HPI as above.     Physical Exam Triage Vital Signs ED Triage Vitals  Enc Vitals Group     BP 08/04/20 0837 125/81     Pulse Rate 08/04/20 0837 68     Resp 08/04/20 0837 18      Temp 08/04/20 0837 98.2 F (36.8 C)     Temp Source 08/04/20 0837 Oral     SpO2 08/04/20 0837 97 %     Weight --      Height --      Head Circumference --      Peak Flow --      Pain Score 08/04/20 0839 0     Pain Loc --      Pain Edu? --      Excl. in GC? --    No data found.  Updated Vital Signs BP 125/81 (BP Location: Right Arm)    Pulse 68    Temp 98.2 F (36.8 C) (Oral)    Resp 18    SpO2 97%   Physical Exam Constitutional:      General: He is not in acute distress.    Appearance: Normal appearance. He is well-developed. He is not ill-appearing, toxic-appearing or diaphoretic.  HENT:     Head: Normocephalic and atraumatic.     Right Ear: Tympanic membrane, ear canal and external ear normal. Tympanic membrane is not erythematous or bulging.     Left Ear: Tympanic membrane, ear canal and external ear normal. Tympanic membrane is not  erythematous or bulging.     Nose:     Right Sinus: No maxillary sinus tenderness or frontal sinus tenderness.     Left Sinus: No maxillary sinus tenderness or frontal sinus tenderness.     Mouth/Throat:     Mouth: Mucous membranes are moist.     Pharynx: Oropharynx is clear. Uvula midline.  Eyes:     Extraocular Movements: Extraocular movements intact.     Conjunctiva/sclera: Conjunctivae normal.     Pupils: Pupils are equal, round, and reactive to light.  Cardiovascular:     Rate and Rhythm: Normal rate and regular rhythm.  Pulmonary:     Effort: Pulmonary effort is normal. No accessory muscle usage, prolonged expiration, respiratory distress or retractions.     Breath sounds: No decreased air movement or transmitted upper airway sounds. No decreased breath sounds.     Comments: LCTAB Musculoskeletal:     Cervical back: Normal range of motion and neck supple. No pain with movement, spinous process tenderness or muscular tenderness.  Skin:    General: Skin is warm and dry.  Neurological:     Mental Status: He is alert and oriented  to person, place, and time.     Comments: Grossly intact without focal deficits. Strength 5/5 BUE/BLE. Sensation intact. Able to ambulate with normal gait.       UC Treatments / Results  Labs (all labs ordered are listed, but only abnormal results are displayed) Labs Reviewed - No data to display  EKG   Radiology No results found.  Procedures Procedures (including critical care time)  Medications Ordered in UC Medications - No data to display  Initial Impression / Assessment and Plan / UC Course  I have reviewed the triage vital signs and the nursing notes.  Pertinent labs & imaging results that were available during my care of the patient were reviewed by me and considered in my medical decision making (see chart for details).    Patient without current headache. Needs work note. Has PCP appt scheduled, to follow up as directed. Return precautions given.  Final Clinical Impressions(s) / UC Diagnoses   Final diagnoses:  Acute intractable headache, unspecified headache type    ED Prescriptions    None     PDMP not reviewed this encounter.   Belinda Fisher, PA-C 08/04/20 [phone]

## 2020-08-04 NOTE — ED Triage Notes (Signed)
Pt states he has had headaches for 2 days and has had to leave work. Pt states he has a hx of hypertension but has been taking his medications regularly. 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

## 2020-08-04 NOTE — Discharge Instructions (Signed)
No alarming signs on exam. Please follow up with PCP for further evaluation of frequent headaches. If having sudden worse headache of your life, nausea/vomiting, vision changes, confusion, go to the emergency department for further evaluation.

## 2021-09-05 ENCOUNTER — Other Ambulatory Visit: Payer: Self-pay

## 2021-09-05 ENCOUNTER — Encounter (HOSPITAL_BASED_OUTPATIENT_CLINIC_OR_DEPARTMENT_OTHER): Payer: Self-pay

## 2021-09-05 ENCOUNTER — Emergency Department (HOSPITAL_BASED_OUTPATIENT_CLINIC_OR_DEPARTMENT_OTHER): Payer: Self-pay

## 2021-09-05 ENCOUNTER — Emergency Department (HOSPITAL_BASED_OUTPATIENT_CLINIC_OR_DEPARTMENT_OTHER)
Admission: EM | Admit: 2021-09-05 | Discharge: 2021-09-05 | Disposition: A | Discharge status: Home / Self Care | Payer: Self-pay | Attending: Emergency Medicine | Admitting: Emergency Medicine

## 2021-09-05 DIAGNOSIS — Z79899 Other long term (current) drug therapy: Secondary | ICD-10-CM | POA: Insufficient documentation

## 2021-09-05 DIAGNOSIS — M109 Gout, unspecified: Secondary | ICD-10-CM

## 2021-09-05 DIAGNOSIS — M10071 Idiopathic gout, right ankle and foot: Secondary | ICD-10-CM | POA: Insufficient documentation

## 2021-09-05 DIAGNOSIS — I10 Essential (primary) hypertension: Secondary | ICD-10-CM | POA: Insufficient documentation

## 2021-09-05 MED ORDER — KETOROLAC TROMETHAMINE 60 MG/2ML IM SOLN
30.0000 mg | Freq: Once | INTRAMUSCULAR | Status: AC
  Administered 2021-09-05: 30 mg via INTRAMUSCULAR
  Filled 2021-09-05: qty 2

## 2021-09-05 MED ORDER — COLCHICINE 0.6 MG PO TABS: 0.6000 mg | ORAL_TABLET | Freq: Every day | ORAL | 0 refills | Status: DC

## 2021-09-05 MED ORDER — COLCHICINE 0.6 MG PO TABS
1.2000 mg | ORAL_TABLET | Freq: Once | ORAL | Status: AC
  Administered 2021-09-05: 1.2 mg via ORAL
  Filled 2021-09-05: qty 2

## 2021-09-05 NOTE — Discharge Instructions (Addendum)
You were seen in today for evaluation of your right foot pain.  Your exam is likely consistent with gout.  You have been prescribed colchicine, an anti-inflammatory, to take for this.  You have been given your initial dose here, please take an additional pill 1 hour later.  Your next dose after today should be daily for the next 4 days.  Please take as prescribed.  Additionally, you may need to be on a maintenance medication if you are gout appears again.  Additional information on gout and low purine diet included in this discharge paperwork.  A PCP has been attached to this discharge report if you cannot get in with your own.  If you have any concern, new or worsening symptoms, please return to the nearest emergency department for reevaluation.

## 2021-09-05 NOTE — ED Provider Notes (Signed)
[facility] EMERGENCY DEPARTMENT Provider Note   CSN: [id number] Arrival date & time: 09/05/21  1287     History Chief Complaint  Patient presents with   Foot Pain    Thomas King is a 40 y.o. male presents the emergency department for evaluation of right foot pain for the past 3 days.  Patient reports the pain is intermittent and throbbing.  He denies any new activity, traumas, falls, or MVC's, the patient recently started a new job 5 days ago.  He reports the pain is at the dorsum of his foot and is having some tenderness to touch.  Denies any weakness in the foot.  Patient is ambulatory.  The patient reports he got a new pair shoes yesterday, but is still experiencing the pain.  He denies any medical history. Denies any history of gout. Surgical history includes left knee.  No daily medications.  No known drug allergies.  Denies any tobacco, EtOH, or drug use.   Foot Pain      Past Medical History:  Diagnosis Date   Frequent headaches    Hypertension     Patient Active Problem List   Diagnosis Date Noted   Preventative health care 07/28/2018   HTN (hypertension) 10/01/2014   Hyperlipidemia 10/01/2014   Ulna fracture 09/14/2014   Left knee pain 10/11/2013   Obesity (BMI 30-39.9) 07/29/2013    Past Surgical History:  Procedure Laterality Date   ARTHROSCOPIC REPAIR ACL Left    x's 2        Family History  Problem Relation Age of Onset   Hypertension Mother     Social History   Tobacco Use   Smoking status: Never   Smokeless tobacco: Never  Vaping Use   Vaping Use: Never used  Substance Use Topics   Alcohol use: Yes    Comment: Occ   Drug use: No    Home Medications Prior to Admission medications   Medication Sig Start Date End Date Taking? Authorizing Provider  colchicine 0.6 MG tablet Take 1 tablet (0.6 mg total) by mouth daily. 09/05/21  Yes Achille Rich, PA-C  amLODipine (NORVASC) 5 MG tablet Take 1 tablet (5 mg total) by mouth daily.  08/01/19   Donato Schultz, DO    Allergies    Other and Shellfish allergy  Review of Systems   Review of Systems  Constitutional:  Negative for chills and fever.  Musculoskeletal:  Positive for myalgias. Negative for back pain, gait problem and joint swelling.  Neurological:  Negative for weakness and numbness.  All other systems reviewed and are negative.  Physical Exam Updated Vital Signs BP (!) 136/98   Pulse 68   Temp 97.8 F (36.6 C) (Oral)   Resp 16   Ht 5\' 9"  (1.753 m)   Wt 109.3 kg   SpO2 98%   BMI 35.59 kg/m   Physical Exam Vitals and nursing note reviewed.  Constitutional:      General: He is not in acute distress.    Appearance: Normal appearance. He is not toxic-appearing.  Eyes:     General: No scleral icterus. Pulmonary:     Effort: Pulmonary effort is normal. No respiratory distress.  Musculoskeletal:        General: Tenderness present. No deformity or signs of injury.     Right lower leg: No edema.     Left lower leg: No edema.     Comments: Strong DP and PT pulses bilaterally.  Sensation intact.  Compartments soft.  Patient is tender to palpation over the mid right dorsal of the foot.  No obvious deformity or trauma to the area.  There is slight darkening of coloration, but no swelling appreciated.  Strength intact.  Ambulatory.  Skin:    General: Skin is dry.     Findings: No rash.  Neurological:     General: No focal deficit present.     Mental Status: He is alert. Mental status is at baseline.     Sensory: No sensory deficit.     Motor: No weakness.     Gait: Gait normal.  Psychiatric:        Mood and Affect: Mood normal.    ED Results / Procedures / Treatments   Labs (all labs ordered are listed, but only abnormal results are displayed) Labs Reviewed - No data to display  EKG None  Radiology DG Foot Complete Right  Result Date: 09/05/2021 CLINICAL DATA:  Pain EXAM: RIGHT FOOT COMPLETE - 3+ VIEW COMPARISON:  None. FINDINGS:  There is no evidence of fracture or dislocation. Mild degenerative changes of the midfoot. Well corticated osseous fragment of the medial malleolus, likely sequela of remote prior injury. Soft tissues are unremarkable. IMPRESSION: No acute osseous abnormality. Electronically Signed   By: Allegra Lai M.D.   On: 09/05/2021 10:37    Procedures Procedures   Medications Ordered in ED Medications  colchicine tablet 1.2 mg (has no administration in time range)  ketorolac (TORADOL) injection 30 mg (30 mg Intramuscular Given 09/05/21 1025)    ED Course  I have reviewed the triage vital signs and the nursing notes.  Pertinent labs & imaging results that were available during my care of the patient were reviewed by me and considered in my medical decision making (see chart for details).  40 year old male patient presents emergency department with 3 days of right dorsal foot pain.  Differential diagnosis includes but not limited to fracture, MSK, gout, tendinitis. On physical exam, patient has tenderness palpation of the right dorsum of his foot with an area of slight discoloration around the medial aspect.  Tender to palpation.  Good cap refill.  Pulses intact.  X-ray obtained and shows an old injury to the medial malleoli, otherwise no acute osseous abnormality.  Exam likely consistent with gout.  We will give patient Toradol and first dose of colchicine here and sent home on colchicine.  Additional education on gout provided to the patient.  I recommended that he still keep up wearing his new shoes to help with the support of his feet and back.  Recommended that he follow-up with PCP for this problem.  Return precautions discussed.  Patient agrees to plan.  Patient is stable being discharged home in good condition.    MDM Rules/Calculators/A&P                          Final Clinical Impression(s) / ED Diagnoses Final diagnoses:  Acute gout of right foot, unspecified cause    Rx / DC Orders ED  Discharge Orders          Ordered    colchicine 0.6 MG tablet  Daily        09/05/21 1121             Achille Rich, [place] 09/05/21 1124    Benjiman Core, MD 09/05/21 1531

## 2021-09-05 NOTE — ED Triage Notes (Signed)
Pt reports right foot pain x3days, tried changing shoes, soaking, has not taken any medication.  Able to bear weight but painful.

## 2021-09-17 ENCOUNTER — Ambulatory Visit (INDEPENDENT_AMBULATORY_CARE_PROVIDER_SITE_OTHER): Payer: Self-pay | Admitting: Podiatry

## 2021-09-17 ENCOUNTER — Other Ambulatory Visit: Payer: Self-pay

## 2021-09-17 ENCOUNTER — Encounter: Payer: Self-pay | Admitting: Podiatry

## 2021-09-17 ENCOUNTER — Ambulatory Visit (INDEPENDENT_AMBULATORY_CARE_PROVIDER_SITE_OTHER): Payer: Self-pay

## 2021-09-17 DIAGNOSIS — M19071 Primary osteoarthritis, right ankle and foot: Secondary | ICD-10-CM

## 2021-09-17 DIAGNOSIS — M19072 Primary osteoarthritis, left ankle and foot: Secondary | ICD-10-CM

## 2021-09-17 MED ORDER — BETAMETHASONE SOD PHOS & ACET 6 (3-3) MG/ML IJ SUSP
3.0000 mg | Freq: Once | INTRAMUSCULAR | Status: AC
Start: 2021-09-17 — End: 2021-09-17
  Administered 2021-09-17: 16:00:00 3 mg via INTRA_ARTICULAR

## 2021-09-17 MED ORDER — DICLOFENAC SODIUM 75 MG PO TBEC: 75.0000 mg | DELAYED_RELEASE_TABLET | Freq: Two times a day (BID) | ORAL | 1 refills | Status: DC

## 2021-09-17 MED ORDER — METHYLPREDNISOLONE 4 MG PO TBPK: ORAL_TABLET | ORAL | 0 refills | Status: DC

## 2021-09-17 NOTE — Progress Notes (Signed)
° °  HPI: 40 y.o. male presenting today as a new patient with his fiance for evaluation of bilateral foot and ankle pain is been going on for several months.  He denies a history of injury or trauma to the area.  He actually went to the emergency department on 09/05/2021.  He was referred here.  He presents for follow-up treatment evaluation  Past Medical History:  Diagnosis Date   Frequent headaches    Hypertension     Past Surgical History:  Procedure Laterality Date   ARTHROSCOPIC REPAIR ACL Left    x's 2      Physical Exam: General: The patient is alert and oriented x3 in no acute distress.  Dermatology: Skin is warm, dry and supple bilateral lower extremities. Negative for open lesions or macerations.  Vascular: Palpable pedal pulses bilaterally. No edema or erythema noted. Capillary refill within normal limits.  Neurological: Epicritic and protective threshold grossly intact bilaterally.   Musculoskeletal Exam: Range of motion within normal limits to all pedal and ankle joints bilateral. Muscle strength 5/5 in all groups bilateral.  Pain on palpation of the bilateral ankle joints as well as the first TMT joint of the foot bilateral consistent with a capsulitis and degenerative changes that are noted on x-ray.  Weightbearing demonstrates collapse of the medial longitudinal arch with a flatfoot deformity on weightbearing  Radiographic Exam:  Normal osseous mineralization.  Degenerative changes noted throughout the ankle and pedal joints of the foot with periarticular spurring.  There appears to be old chronic fractures of the foot and ankle which have healed.  Collapse of the medial longitudinal arch with pes planus deformity noted  Assessment: 1.  DJD bilateral foot and ankle 2.  Pes planovalgus deformity bilateral   Plan of Care:  1. Patient evaluated. X-Rays reviewed.  2.  Injection of 0.5 cc Celestone Soluspan injected with first TMT joint bilateral 3.  Prescription for  Medrol Dosepak 4.  Prescription for diclofenac 75 mg 2 times daily as needed 5.  Appointment with Pedorthist for custom molded orthotics 6.  Return to clinic as needed      Felecia Shelling, DPM [facility]  Dr. Felecia Shelling, DPM    [address] [place], [place] [address]                Office [phone]  Fax [phone]

## 2021-09-26 ENCOUNTER — Other Ambulatory Visit: Payer: Self-pay

## 2021-11-15 ENCOUNTER — Other Ambulatory Visit: Payer: Self-pay

## 2021-11-15 ENCOUNTER — Emergency Department (HOSPITAL_BASED_OUTPATIENT_CLINIC_OR_DEPARTMENT_OTHER)
Admission: EM | Admit: 2021-11-15 | Discharge: 2021-11-15 | Disposition: A | Discharge status: Home / Self Care | Payer: Self-pay | Attending: Emergency Medicine | Admitting: Emergency Medicine

## 2021-11-15 ENCOUNTER — Emergency Department (HOSPITAL_BASED_OUTPATIENT_CLINIC_OR_DEPARTMENT_OTHER): Payer: Self-pay

## 2021-11-15 ENCOUNTER — Encounter (HOSPITAL_BASED_OUTPATIENT_CLINIC_OR_DEPARTMENT_OTHER): Payer: Self-pay | Admitting: Emergency Medicine

## 2021-11-15 DIAGNOSIS — I10 Essential (primary) hypertension: Secondary | ICD-10-CM | POA: Insufficient documentation

## 2021-11-15 DIAGNOSIS — Z79899 Other long term (current) drug therapy: Secondary | ICD-10-CM | POA: Insufficient documentation

## 2021-11-15 DIAGNOSIS — M25462 Effusion, left knee: Secondary | ICD-10-CM | POA: Insufficient documentation

## 2021-11-15 NOTE — ED Notes (Signed)
Patient transported to X-ray 

## 2021-11-15 NOTE — ED Provider Notes (Signed)
[facility] EMERGENCY DEPARTMENT Provider Note   CSN: [id number] Arrival date & time: 11/15/21  1301     History  Chief Complaint  Patient presents with   Knee Pain    Thomas King is a 41 y.o. male. PMH of HTN, HLD, and chronic left knee pain.  Patient presents with left knee swelling and pain for about 2 days.  He denies any traumatic event.  He has a history of 2 ACL repairs most recent back in 2012.  He has had problems with this left knee ever since.  He had similar symptoms occur about 3 years ago and he had to be seen by orthopedic to have the effusion drained.  He denies any fevers or chills.  He has no other systemic symptoms.  He is weightbearing.   Knee Pain     Home Medications Prior to Admission medications   Medication Sig Start Date End Date Taking? Authorizing Provider  amLODipine (NORVASC) 5 MG tablet Take 1 tablet (5 mg total) by mouth daily. 08/01/19   Seabron Spates R, DO  colchicine 0.6 MG tablet Take 1 tablet (0.6 mg total) by mouth daily. 09/05/21   Achille Rich, PA-C  diclofenac (VOLTAREN) 75 MG EC tablet Take 1 tablet (75 mg total) by mouth 2 (two) times daily. 09/17/21   Felecia Shelling, DPM  methylPREDNISolone (MEDROL DOSEPAK) 4 MG TBPK tablet 6 day dose pack - take as directed 09/17/21   Felecia Shelling, DPM      Allergies    Other and Shellfish allergy    Review of Systems   Review of Systems  Musculoskeletal:  Positive for arthralgias and joint swelling.  All other systems reviewed and are negative.  Physical Exam Updated Vital Signs BP (!) 145/104 (BP Location: Right Arm)    Pulse 62    Temp 98.1 F (36.7 C) (Oral)    Resp 20    Ht 5\' 9"  (1.753 m)    Wt 104.3 kg    SpO2 100%    BMI 33.97 kg/m  Physical Exam Vitals and nursing note reviewed.  Constitutional:      General: He is not in acute distress.    Appearance: Normal appearance. He is well-developed. He is not ill-appearing, toxic-appearing or diaphoretic.  HENT:      Head: Normocephalic and atraumatic.     Nose: No nasal deformity.     Mouth/Throat:     Lips: Pink. No lesions.  Eyes:     General: Gaze aligned appropriately. No scleral icterus.       Right eye: No discharge.        Left eye: No discharge.     Conjunctiva/sclera: Conjunctivae normal.     Right eye: Right conjunctiva is not injected. No exudate or hemorrhage.    Left eye: Left conjunctiva is not injected. No exudate or hemorrhage. Pulmonary:     Effort: Pulmonary effort is normal. No respiratory distress.  Musculoskeletal:     Left knee: Swelling present. No erythema or crepitus. Normal range of motion.     Comments: There is swelling to the left knee.  It does not feel diffusely warm.  No erythema surrounding.  I am able to range this knee without significant pain.  He does have some tenderness to the lateral medial side of the knee joint. Pedal pulses are 2+ bilaterally.  Skin:    General: Skin is warm and dry.  Neurological:     Mental Status: He is  alert and oriented to person, place, and time.  Psychiatric:        Mood and Affect: Mood normal.        Speech: Speech normal.        Behavior: Behavior normal. Behavior is cooperative.    ED Results / Procedures / Treatments   Labs (all labs ordered are listed, but only abnormal results are displayed) Labs Reviewed - No data to display  EKG None  Radiology DG Knee Complete 4 Views Left  Result Date: 11/15/2021 CLINICAL DATA:  LEFT knee swelling and pain. EXAM: LEFT KNEE - COMPLETE 4+ VIEW COMPARISON:  October of 2019. FINDINGS: Status post ACL repair as before. Tunnel for ligamentous repair is noted within the tibia and fibula. No acute fracture. Signs of loose bodies throughout the joint. Degenerative changes and medial subluxation of the femur relative to the tibia all without change. Moderate LEFT suprapatellar effusion. This isw increased compared to previous imaging. IMPRESSION: 1. Moderate suprapatellar effusion,  increased compared to previous imaging. 2. Tricompartmental osteoarthritic changes with changes of ACL repair and a loose bodies about the joint. No visible fracture or sign of dislocation. Electronically Signed   By: Donzetta Kohut M.D.   On: 11/15/2021 14:21    Procedures Procedures   Medications Ordered in ED Medications - No data to display  ED Course/ Medical Decision Making/ A&P                           Medical Decision Making Problems Addressed: Effusion of left knee: acute illness or injury  Amount and/or Complexity of Data Reviewed External Data Reviewed: notes.    Details: reviewed previous ortho notes Radiology: ordered and independent interpretation performed. Decision-making details documented in ED Course.  Risk OTC drugs.   This is a 41 y.o. male with a PMH of HTN, HLD, and chronic left knee pain who presents to the ED with 2 days of left knee swelling and pain.  Patient has normal vitals.  He is afebrile.  He appears well and complains of no systemic symptoms.  Left knee is more swollen than right, however it is not warm.  I am able to range this joint without significant pain.  I do not think patient is septic or has a septic joint. I reviewed past medical records and looks like he was seen by [facility] in 2019 for similar symptoms.  At that time he had that knee drained. Plan to obtain plain films of knee to assess degree of effusion and if there is any underlying injury.   I personally reviewed all laboratory work and imaging. Abnormal results outlined below. X-ray reveals IMPRESSION:  1. Moderate suprapatellar effusion, increased compared to previous  imaging.  2. Tricompartmental osteoarthritic changes with changes of ACL  repair and a loose bodies about the joint. No visible fracture or  sign of dislocation.    Impression: Effusion is likely due to chronic osteoarthritis buildup.  I reassessed the knee and patient is still able to range it normally  without much difficulty or pain.  He has normal ambulation.  Is very unlikely that this is a septic joint.  I think that he should be stable to follow-up with orthopedic outpatient to be evaluated for drainage of the fluid.  Portions of this note were generated with Scientist, clinical (histocompatibility and immunogenetics). Dictation errors may occur despite best attempts at proofreading.  Final Clinical Impression(s) / ED Diagnoses Final diagnoses:  Effusion of  left knee    Rx / DC Orders ED Discharge Orders     None         Claudie Leach, PA-C 11/15/21 1650    Terrilee Files, MD 11/15/21 1800

## 2021-11-15 NOTE — ED Notes (Signed)
Pt sitting up in bed, pt has swelling to his L knee, pt reports pain for the past three days and then swelling starting today.

## 2021-11-15 NOTE — Discharge Instructions (Addendum)
Please call the orthopedic office and schedule an appointment regarding the fluid in your joint. In the meanwhile, you can take ibuprofen and tylenol for pain. If you develop worsening symptoms or develop associated fever, please return to the ED.

## 2021-11-15 NOTE — ED Triage Notes (Signed)
Pt reports LT knee swelling/pain x 2d; no injury; hx of surg x 2 d/t torn ACL

## 2022-04-16 IMAGING — DX DG KNEE COMPLETE 4+V*L*
4 series · 4 of 4 positions shown · non-contrast
Comparison: Thursday July, 2018.

CLINICAL DATA: LEFT knee swelling and pain.

EXAM:
LEFT KNEE - COMPLETE 4+ VIEW

[knee ap]
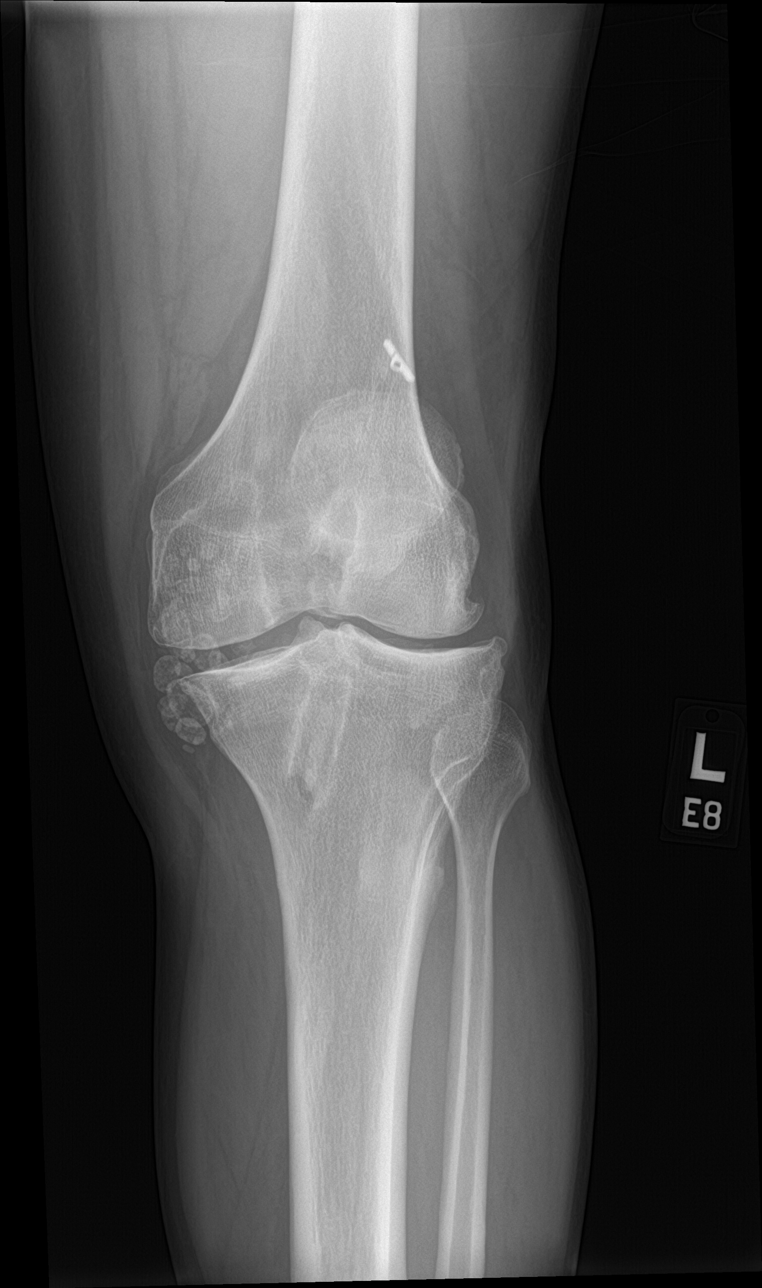

[knee lat]
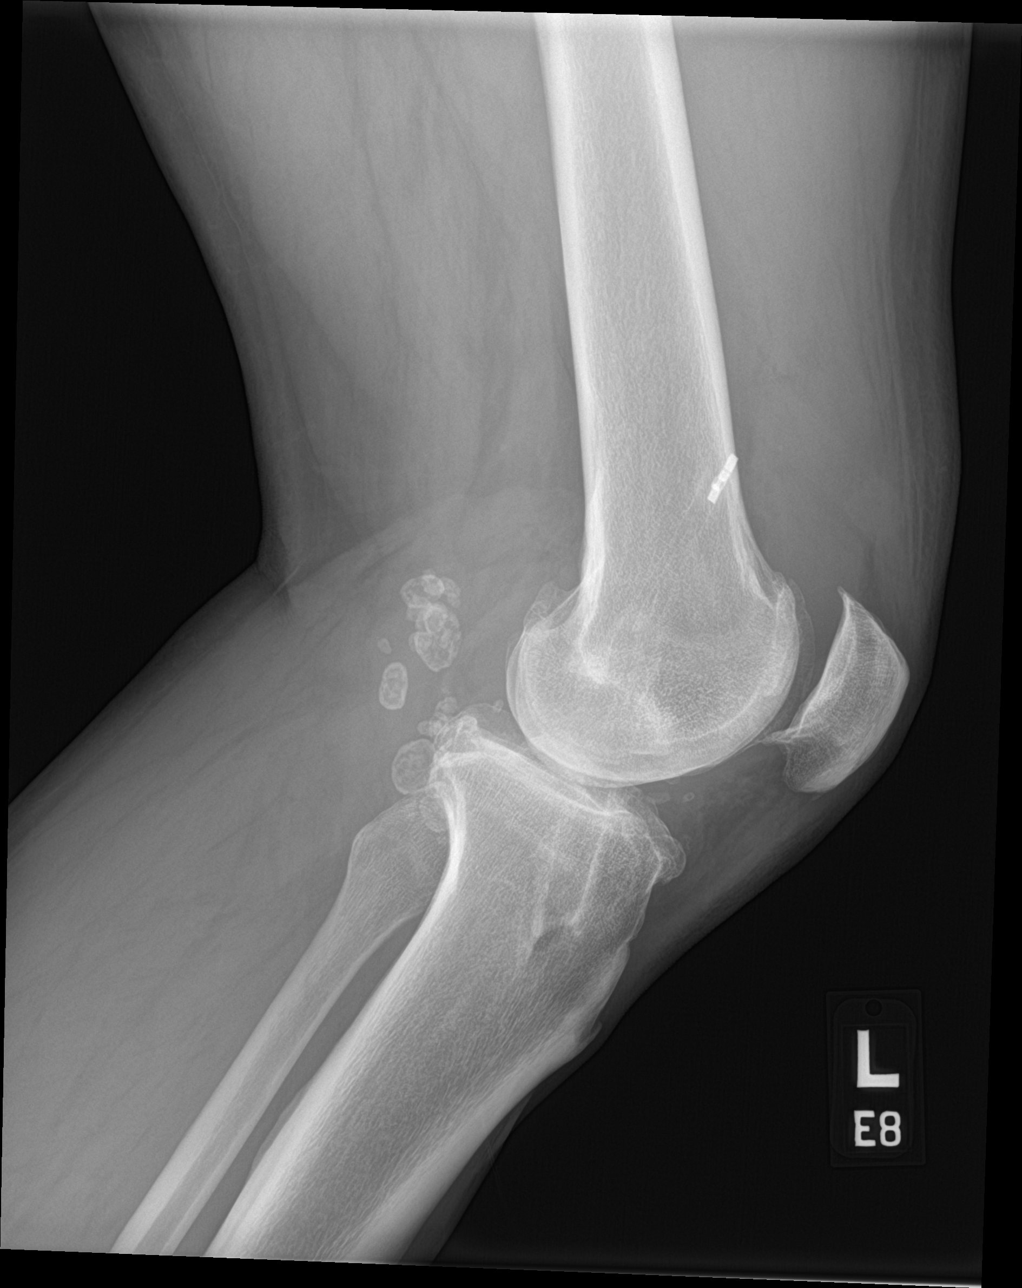

[knee obl (1 of 2)]
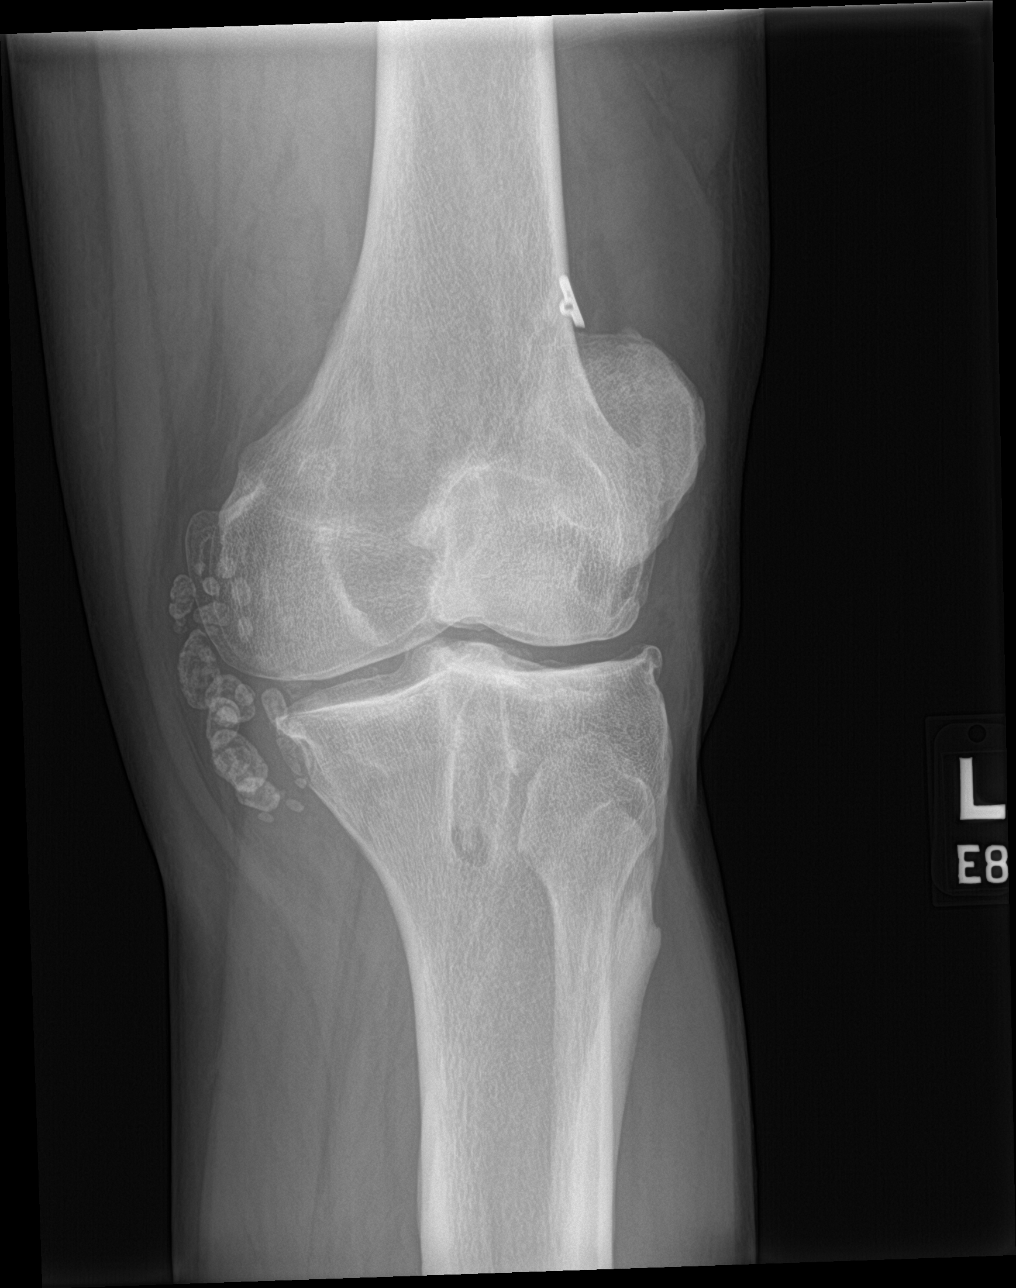

[knee obl (2 of 2)]
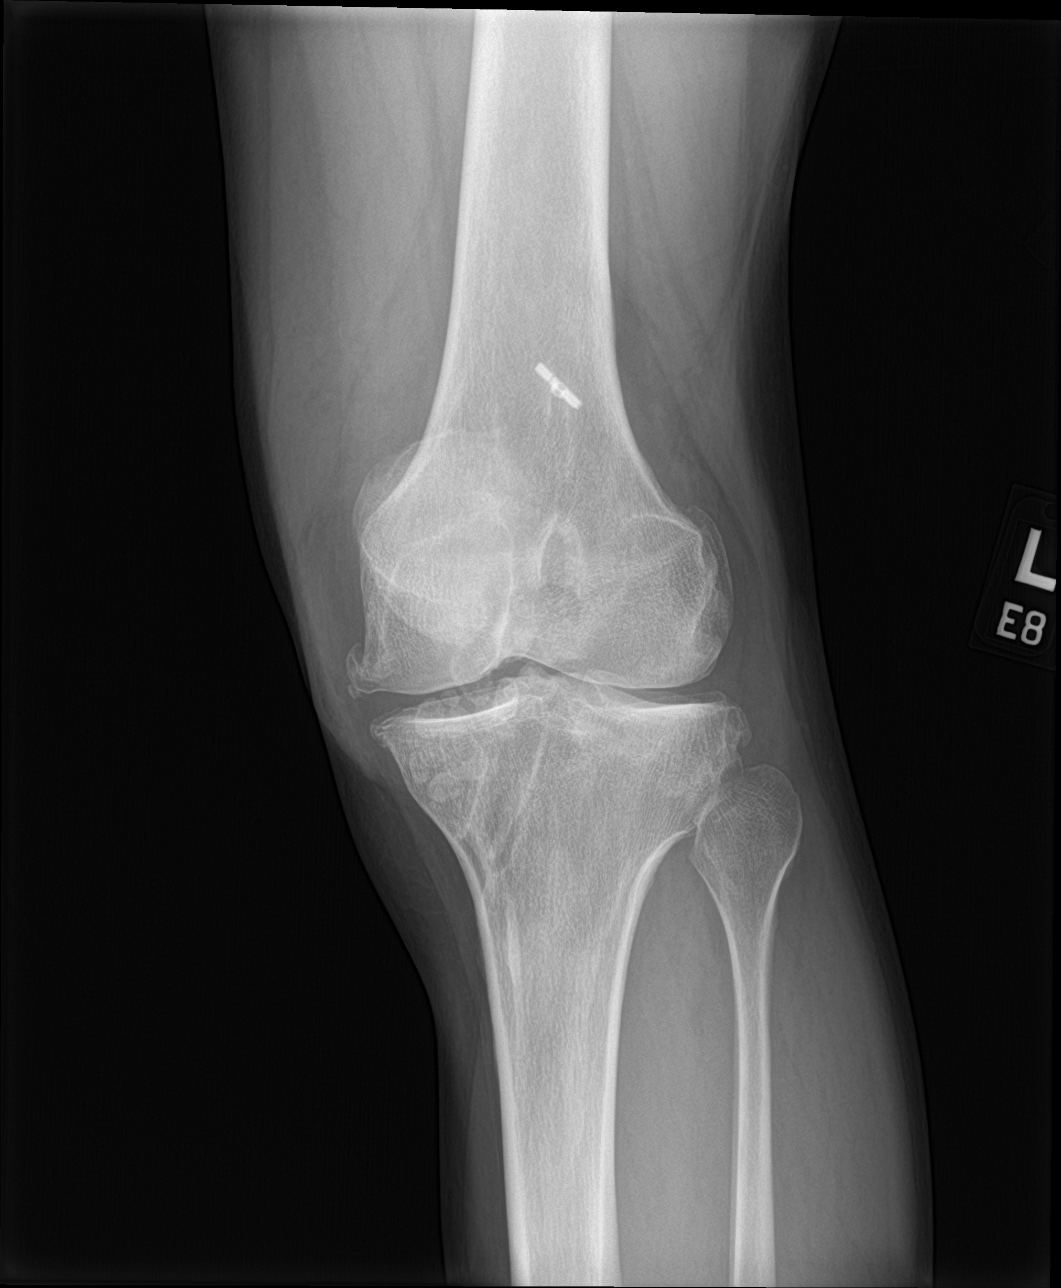

[4 of 4 positions shown; findings below may reference images not displayed]

FINDINGS: Status post ACL repair as before. Tunnel for ligamentous repair is
noted within the tibia and fibula. No acute fracture.

Signs of loose bodies throughout the joint.

Degenerative changes and medial subluxation of the femur relative to
the tibia all without change.

Moderate LEFT suprapatellar effusion. This isw increased compared to
previous imaging.
IMPRESSION: 1. Moderate suprapatellar effusion, increased compared to previous
imaging.
2. Tricompartmental osteoarthritic changes with changes of ACL
repair and a loose bodies about the joint. No visible fracture or
sign of dislocation.

## 2022-05-04 ENCOUNTER — Encounter: Payer: Self-pay | Admitting: Family Medicine

## 2022-05-04 NOTE — Progress Notes (Shared)
Subjective:   By signing my name below, I, Thomas King, attest that this documentation has been prepared under the direction and in the presence of Donato Schultz DO 05/04/2022   Patient ID: Thomas King, male    DOB: 1981/08/20, 41 y.o.   MRN: [id number]  No chief complaint on file.   HPI Patient is in today for a comprehensive physical exam  He denies having any fever, new muscle pain, joint pain , new moles, congestion, sinus pain, sore throat, chest pain, palpations, cough, SOB ,wheezing,n/v/d constipation, blood in stool, dysuria, frequency, hematuria, at this time  Social history: Immunizations: Diet: Exercise: Dental: Vision:   Past Medical History:  Diagnosis Date   Frequent headaches    Hypertension     Past Surgical History:  Procedure Laterality Date   ARTHROSCOPIC REPAIR ACL Left    x's 2     Family History  Problem Relation Age of Onset   Hypertension Mother     Social History   Socioeconomic History   Marital status: Single    Spouse name: Not on file   Number of children: Not on file   Years of education: Not on file   Highest education level: Not on file  Occupational History    Employer: [employer]    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  Tobacco Use   Smoking status: Never   Smokeless tobacco: Never  Vaping Use   Vaping Use: Never used  Substance and Sexual Activity   Alcohol use: Not Currently   Drug use: No   Sexual activity: Yes    Partners: Female    Birth control/protection: Condom  Other Topics Concern   Not on file  Social History Narrative   Exercise-- occassional   Social Determinants of Health   Financial Resource Strain: Not on file  Food Insecurity: Not on file  Transportation Needs: Not on file  Physical Activity: Not on file  Stress: Not on file  Social Connections: Not on file  Intimate Partner Violence: Not on file    Outpatient Medications Prior to Visit  Medication Sig Dispense Refill   amLODipine  (NORVASC) 5 MG tablet Take 1 tablet (5 mg total) by mouth daily. 90 tablet 3   colchicine 0.6 MG tablet Take 1 tablet (0.6 mg total) by mouth daily. 5 tablet 0   diclofenac (VOLTAREN) 75 MG EC tablet Take 1 tablet (75 mg total) by mouth 2 (two) times daily. 60 tablet 1   methylPREDNISolone (MEDROL DOSEPAK) 4 MG TBPK tablet 6 day dose pack - take as directed 21 tablet 0   No facility-administered medications prior to visit.    Allergies  Allergen Reactions   Other Shortness Of Breath and Rash    ALL SEAFOOD   Shellfish Allergy Shortness Of Breath and Rash    Review of Systems  Constitutional:  Negative for fever.  HENT:  Negative for congestion, sinus pain and sore throat.   Respiratory:  Negative for cough, shortness of breath and wheezing.   Cardiovascular:  Negative for chest pain and palpitations.  Gastrointestinal:  Negative for blood in stool, constipation, diarrhea, nausea and vomiting.  Genitourinary:  Negative for dysuria, frequency and hematuria.  Musculoskeletal:  Negative for joint pain and myalgias.  Skin:        (-) New Moles       Objective:    Physical Exam Constitutional:      General: He is not in acute distress.    Appearance: Normal  appearance. He is not ill-appearing.  HENT:     Head: Normocephalic and atraumatic.     Right Ear: Tympanic membrane, ear canal and external ear normal.     Left Ear: Tympanic membrane, ear canal and external ear normal.  Eyes:     Extraocular Movements: Extraocular movements intact.     Pupils: Pupils are equal, round, and reactive to light.  Cardiovascular:     Rate and Rhythm: Normal rate and regular rhythm.     Heart sounds: Normal heart sounds. No murmur heard.    No gallop.  Pulmonary:     Effort: Pulmonary effort is normal. No respiratory distress.     Breath sounds: Normal breath sounds. No wheezing or rales.  Abdominal:     General: Bowel sounds are normal. There is no distension.     Palpations: Abdomen is  soft.     Tenderness: There is no abdominal tenderness. There is no guarding.  Skin:    General: Skin is warm and dry.  Neurological:     Mental Status: He is alert and oriented to person, place, and time.  Psychiatric:        Judgment: Judgment normal.     There were no vitals taken for this visit. Wt Readings from Last 3 Encounters:  11/15/21 230 lb (104.3 kg)  09/05/21 241 lb (109.3 kg)  08/01/19 229 lb 3.2 oz (104 kg)    Diabetic Foot Exam - Simple   No data filed    Lab Results  Component Value Date   WBC 6.7 08/01/2019   HGB 14.5 08/01/2019   HCT 43.4 08/01/2019   PLT 255.0 08/01/2019   GLUCOSE 102 (H) 08/01/2019   CHOL 136 08/01/2019   TRIG 225.0 (H) 08/01/2019   HDL 24.60 (L) 08/01/2019   LDLDIRECT 67.0 08/01/2019   LDLCALC 67 06/07/2018   ALT 28 08/01/2019   AST 23 08/01/2019   NA 139 08/01/2019   K 3.9 08/01/2019   CL 105 08/01/2019   CREATININE 1.05 08/01/2019   BUN 8 08/01/2019   CO2 27 08/01/2019   TSH 1.83 08/01/2019   MICROALBUR 0.4 10/11/2013    Lab Results  Component Value Date   TSH 1.83 08/01/2019   Lab Results  Component Value Date   WBC 6.7 08/01/2019   HGB 14.5 08/01/2019   HCT 43.4 08/01/2019   MCV 89.3 08/01/2019   PLT 255.0 08/01/2019   Lab Results  Component Value Date   NA 139 08/01/2019   K 3.9 08/01/2019   CO2 27 08/01/2019   GLUCOSE 102 (H) 08/01/2019   BUN 8 08/01/2019   CREATININE 1.05 08/01/2019   BILITOT 0.5 08/01/2019   ALKPHOS 86 08/01/2019   AST 23 08/01/2019   ALT 28 08/01/2019   PROT 7.0 08/01/2019   ALBUMIN 4.2 08/01/2019   CALCIUM 9.1 08/01/2019   GFR 95.40 08/01/2019   Lab Results  Component Value Date   CHOL 136 08/01/2019   Lab Results  Component Value Date   HDL 24.60 (L) 08/01/2019   Lab Results  Component Value Date   LDLCALC 67 06/07/2018   Lab Results  Component Value Date   TRIG 225.0 (H) 08/01/2019   Lab Results  Component Value Date   CHOLHDL 6 08/01/2019   No results  found for: "HGBA1C"     Assessment & Plan:   Problem List Items Addressed This Visit   None   No orders of the defined types were placed in this encounter.  I, Carylon Perches, personally preformed the services described in this documentation.  All medical record entries made by the scribe were at my direction and in my presence.  I have reviewed the chart and discharge instructions (if applicable) and agree that the record reflects my personal performance and is accurate and complete. 05/04/2022   I,Amber Collins,acting as a scribe for [employer], DO.,have documented all relevant documentation on the behalf of Ann Held, DO,as directed by  Ann Held, DO while in the presence of Ann Held, DO.    [employer]

## 2022-05-07 ENCOUNTER — Encounter: Payer: Self-pay | Admitting: Family Medicine

## 2022-05-07 ENCOUNTER — Ambulatory Visit (INDEPENDENT_AMBULATORY_CARE_PROVIDER_SITE_OTHER): Payer: BLUE CROSS/BLUE SHIELD | Admitting: Family Medicine

## 2022-05-07 VITALS — BP 150/92 | HR 64 | Temp 98.2°F | Resp 18 | Ht 69.0 in | Wt 241.4 lb

## 2022-05-07 DIAGNOSIS — Z125 Encounter for screening for malignant neoplasm of prostate: Secondary | ICD-10-CM

## 2022-05-07 DIAGNOSIS — I10 Essential (primary) hypertension: Secondary | ICD-10-CM

## 2022-05-07 DIAGNOSIS — Z0000 Encounter for general adult medical examination without abnormal findings: Secondary | ICD-10-CM | POA: Diagnosis not present

## 2022-05-07 DIAGNOSIS — E785 Hyperlipidemia, unspecified: Secondary | ICD-10-CM

## 2022-05-07 DIAGNOSIS — Z202 Contact with and (suspected) exposure to infections with a predominantly sexual mode of transmission: Secondary | ICD-10-CM

## 2022-05-07 DIAGNOSIS — Z1159 Encounter for screening for other viral diseases: Secondary | ICD-10-CM

## 2022-05-07 MED ORDER — AMLODIPINE BESYLATE 5 MG PO TABS: 5.0000 mg | ORAL_TABLET | Freq: Every day | ORAL | 3 refills | Status: DC

## 2022-05-07 NOTE — Assessment & Plan Note (Signed)
Encourage heart healthy diet such as MIND or DASH diet, increase exercise, avoid trans fats, simple carbohydrates and processed foods, consider a krill or fish or flaxseed oil cap daily.  °

## 2022-05-07 NOTE — Progress Notes (Signed)
Established Patient Office Visit  Subjective   Patient ID: Thomas King, male    DOB: Aug 28, 1981  Age: 41 y.o. MRN: [id number]  Chief Complaint  Patient presents with   Annual Exam    Pt states not fasting     HPI  Patient Active Problem List   Diagnosis Date Noted   Preventative health care 07/28/2018   Essential hypertension 10/01/2014   Hyperlipidemia 10/01/2014   Ulna fracture 09/14/2014   Left knee pain 10/11/2013   Obesity (BMI 30-39.9) 07/29/2013   Past Medical History:  Diagnosis Date   Frequent headaches    Hypertension    Past Surgical History:  Procedure Laterality Date   ARTHROSCOPIC REPAIR ACL Left    x's 2    Social History   Tobacco Use   Smoking status: Never   Smokeless tobacco: Never  Vaping Use   Vaping Use: Never used  Substance Use Topics   Alcohol use: Not Currently   Drug use: No   Social History   Socioeconomic History   Marital status: Single    Spouse name: Not on file   Number of children: Not on file   Years of education: Not on file   Highest education level: Not on file  Occupational History    Employer: [employer]    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  Tobacco Use   Smoking status: Never   Smokeless tobacco: Never  Vaping Use   Vaping Use: Never used  Substance and Sexual Activity   Alcohol use: Not Currently   Drug use: No   Sexual activity: Yes    Partners: Female    Birth control/protection: Condom  Other Topics Concern   Not on file  Social History Narrative   Exercise-- occassional   Social Determinants of Health   Financial Resource Strain: Not on file  Food Insecurity: Not on file  Transportation Needs: Not on file  Physical Activity: Not on file  Stress: Not on file  Social Connections: Not on file  Intimate Partner Violence: Not on file   Family Status  Relation Name Status   Mother  Alive   Father  Alive       health hx unknown   Sister  Alive   Brother  Alive   Sister  Alive   Family  History  Problem Relation Age of Onset   Hypertension Mother    Allergies  Allergen Reactions   Other Shortness Of Breath and Rash    ALL SEAFOOD   Shellfish Allergy Shortness Of Breath and Rash      ROS    Objective:     BP (!) 150/92 (BP Location: Left Arm, Patient Position: Sitting, Cuff Size: Normal)   Pulse 64   Temp 98.2 F (36.8 C) (Oral)   Resp 18   Ht 5\' 9"  (1.753 m)   Wt 241 lb 6.4 oz (109.5 kg)   SpO2 94%   BMI 35.65 kg/m  BP Readings from Last 3 Encounters:  05/07/22 (!) 150/92  11/15/21 (!) 145/104  09/05/21 138/84   Wt Readings from Last 3 Encounters:  05/07/22 241 lb 6.4 oz (109.5 kg)  11/15/21 230 lb (104.3 kg)  09/05/21 241 lb (109.3 kg)   SpO2 Readings from Last 3 Encounters:  05/07/22 94%  11/15/21 100%  09/05/21 99%      Physical Exam   No results found for any visits on 05/07/22.  Last CBC Lab Results  Component Value Date   WBC 6.7  08/01/2019   HGB 14.5 08/01/2019   HCT 43.4 08/01/2019   MCV 89.3 08/01/2019   RDW 12.7 08/01/2019   PLT 255.0 08/01/2019   Last metabolic panel Lab Results  Component Value Date   GLUCOSE 102 (H) 08/01/2019   NA 139 08/01/2019   K 3.9 08/01/2019   CL 105 08/01/2019   CO2 27 08/01/2019   BUN 8 08/01/2019   CREATININE 1.05 08/01/2019   CALCIUM 9.1 08/01/2019   PROT 7.0 08/01/2019   ALBUMIN 4.2 08/01/2019   BILITOT 0.5 08/01/2019   ALKPHOS 86 08/01/2019   AST 23 08/01/2019   ALT 28 08/01/2019   Last lipids Lab Results  Component Value Date   CHOL 136 08/01/2019   HDL 24.60 (L) 08/01/2019   LDLCALC 67 06/07/2018   LDLDIRECT 67.0 08/01/2019   TRIG 225.0 (H) 08/01/2019   CHOLHDL 6 08/01/2019   Last hemoglobin A1c No results found for: "HGBA1C" Last thyroid functions Lab Results  Component Value Date   TSH 1.83 08/01/2019   Last vitamin D No results found for: "25OHVITD2", "25OHVITD3", "VD25OH" Last vitamin B12 and Folate No results found for: "VITAMINB12", "FOLATE"    The  10-year ASCVD risk score (Arnett DK, et al., 2019) is: 8.1%    Assessment & Plan:   Problem List Items Addressed This Visit       Unprioritized   Preventative health care - Primary    ghm utd Check labs  See avs      Relevant Orders   CBC with Differential/Platelet   Comprehensive metabolic panel   Lipid panel   TSH   PSA   CBC with Differential/Platelet   Comprehensive metabolic panel   Lipid panel   HIV Antibody (routine testing w rflx)   TSH   RPR   Hyperlipidemia    Encourage heart healthy diet such as MIND or DASH diet, increase exercise, avoid trans fats, simple carbohydrates and processed foods, consider a krill or fish or flaxseed oil cap daily.       Relevant Medications   amLODipine (NORVASC) 5 MG tablet   Essential hypertension    Poorly controlled will alter medications, encouraged DASH diet, minimize caffeine and obtain adequate sleep. Report concerning symptoms and follow up as directed and as needed Pt has been off bp meds for a few years       Relevant Medications   amLODipine (NORVASC) 5 MG tablet   Other Relevant Orders   CBC with Differential/Platelet   Comprehensive metabolic panel   Lipid panel   TSH   PSA   Other Visit Diagnoses     Need for hepatitis C screening test       Relevant Orders   Hepatitis C Antibody   Morbid obesity (HCC)       Relevant Orders   Amb Ref to Medical Weight Management       No follow-ups on file.    Donato Schultz, DO

## 2022-05-07 NOTE — Assessment & Plan Note (Signed)
ghm utd Check labs  See avs  

## 2022-05-07 NOTE — Assessment & Plan Note (Addendum)
Poorly controlled will alter medications, encouraged DASH diet, minimize caffeine and obtain adequate sleep. Report concerning symptoms and follow up as directed and as needed Pt has been off bp meds for a few years

## 2022-05-07 NOTE — Patient Instructions (Signed)

## 2022-05-08 ENCOUNTER — Other Ambulatory Visit (HOSPITAL_COMMUNITY)
Admission: RE | Admit: 2022-05-08 | Discharge: 2022-05-08 | Disposition: A | Discharge status: Home / Self Care | Payer: BLUE CROSS/BLUE SHIELD | Source: Ambulatory Visit | Attending: Family Medicine | Admitting: Family Medicine

## 2022-05-08 DIAGNOSIS — Z202 Contact with and (suspected) exposure to infections with a predominantly sexual mode of transmission: Secondary | ICD-10-CM | POA: Insufficient documentation

## 2022-05-08 LAB — LIPID PANEL
Cholesterol: 149 mg/dL (ref 0–200)
HDL: 24.7 mg/dL — ABNORMAL LOW (ref >39.00)
NonHDL: 124.43
Total CHOL/HDL Ratio: 6
Triglycerides: 313 mg/dL — ABNORMAL HIGH (ref 0.0–149.0)
VLDL: 62.6 mg/dL — ABNORMAL HIGH (ref 0.0–40.0)

## 2022-05-08 LAB — COMPREHENSIVE METABOLIC PANEL
ALT: 28 U/L (ref 0–53)
AST: 23 U/L (ref 0–37)
Albumin: 4.4 g/dL (ref 3.5–5.2)
Alkaline Phosphatase: 101 U/L (ref 39–117)
BUN: 8 mg/dL (ref 6–23)
CO2: 31 mEq/L (ref 19–32)
Calcium: 9.4 mg/dL (ref 8.4–10.5)
Chloride: 103 mEq/L (ref 96–112)
Creatinine, Ser: 1.23 mg/dL (ref 0.40–1.50)
GFR: 73.05 mL/min (ref >60.00)
Glucose, Bld: 71 mg/dL (ref 70–99)
Potassium: 4.3 mEq/L (ref 3.5–5.1)
Sodium: 139 mEq/L (ref 135–145)
Total Bilirubin: 0.4 mg/dL (ref 0.2–1.2)
Total Protein: 7.5 g/dL (ref 6.0–8.3)

## 2022-05-08 LAB — RPR: RPR Ser Ql: NONREACTIVE

## 2022-05-08 LAB — PSA: PSA: 0.5 ng/mL (ref 0.10–4.00)

## 2022-05-08 LAB — CBC WITH DIFFERENTIAL/PLATELET
Basophils Absolute: 0 10*3/uL (ref 0.0–0.1)
Basophils Relative: 0.6 % (ref 0.0–3.0)
Eosinophils Absolute: 0.4 10*3/uL (ref 0.0–0.7)
Eosinophils Relative: 5.6 % — ABNORMAL HIGH (ref 0.0–5.0)
HCT: 42.8 % (ref 39.0–52.0)
Hemoglobin: 14.3 g/dL (ref 13.0–17.0)
Lymphocytes Relative: 25.7 % (ref 12.0–46.0)
Lymphs Abs: 1.9 10*3/uL (ref 0.7–4.0)
MCHC: 33.3 g/dL (ref 30.0–36.0)
MCV: 91.4 fl (ref 78.0–100.0)
Monocytes Absolute: 0.5 10*3/uL (ref 0.1–1.0)
Monocytes Relative: 6.6 % (ref 3.0–12.0)
Neutro Abs: 4.6 10*3/uL (ref 1.4–7.7)
Neutrophils Relative %: 61.5 % (ref 43.0–77.0)
Platelets: 263 10*3/uL (ref 150.0–400.0)
RBC: 4.69 Mil/uL (ref 4.22–5.81)
RDW: 13.3 % (ref 11.5–15.5)
WBC: 7.5 10*3/uL (ref 4.0–10.5)

## 2022-05-08 LAB — HIV ANTIBODY (ROUTINE TESTING W REFLEX): HIV 1&2 Ab, 4th Generation: NONREACTIVE

## 2022-05-08 LAB — LDL CHOLESTEROL, DIRECT: Direct LDL: 89 mg/dL

## 2022-05-08 LAB — TSH: TSH: 0.98 u[IU]/mL (ref 0.35–5.50)

## 2022-05-08 LAB — HEPATITIS C ANTIBODY: Hepatitis C Ab: NONREACTIVE

## 2022-05-08 NOTE — Addendum Note (Signed)
Addended by: Roxanne Gates on: 05/08/2022 08:41 AM   Modules accepted: Orders

## 2022-05-11 LAB — URINE CYTOLOGY ANCILLARY ONLY
Chlamydia: NEGATIVE
Comment: NEGATIVE
Comment: NEGATIVE
Comment: NORMAL
Neisseria Gonorrhea: NEGATIVE
Trichomonas: NEGATIVE

## 2022-05-14 ENCOUNTER — Telehealth: Payer: Self-pay | Admitting: Family Medicine

## 2022-05-14 NOTE — Telephone Encounter (Signed)
Patient requesting call back to discuss labs.

## 2022-05-14 NOTE — Telephone Encounter (Signed)
Spoke with patient. Pt verbalized understanding. Labs placed at the front for pick up requested by patient.

## 2022-05-28 DIAGNOSIS — Z8249 Family history of ischemic heart disease and other diseases of the circulatory system: Secondary | ICD-10-CM | POA: Insufficient documentation

## 2022-06-30 DIAGNOSIS — R369 Urethral discharge, unspecified: Secondary | ICD-10-CM | POA: Insufficient documentation

## 2022-06-30 DIAGNOSIS — L219 Seborrheic dermatitis, unspecified: Secondary | ICD-10-CM | POA: Insufficient documentation

## 2023-04-14 ENCOUNTER — Encounter: Payer: Self-pay | Admitting: Emergency Medicine

## 2023-04-14 ENCOUNTER — Other Ambulatory Visit: Payer: Self-pay

## 2023-04-14 ENCOUNTER — Ambulatory Visit
Admission: EM | Admit: 2023-04-14 | Discharge: 2023-04-14 | Disposition: A | Discharge status: Home / Self Care | Payer: BLUE CROSS/BLUE SHIELD | Attending: Family Medicine | Admitting: Family Medicine

## 2023-04-14 DIAGNOSIS — B029 Zoster without complications: Secondary | ICD-10-CM | POA: Diagnosis not present

## 2023-04-14 MED ORDER — GABAPENTIN 300 MG PO CAPS: 300.0000 mg | ORAL_CAPSULE | Freq: Two times a day (BID) | ORAL | 0 refills | Status: DC | PRN

## 2023-04-14 MED ORDER — TRIAMCINOLONE ACETONIDE 0.025 % EX CREA: 1.0000 | TOPICAL_CREAM | Freq: Three times a day (TID) | CUTANEOUS | 0 refills | Status: DC | PRN

## 2023-04-14 MED ORDER — VALACYCLOVIR HCL 1 G PO TABS: 1000.0000 mg | ORAL_TABLET | Freq: Three times a day (TID) | ORAL | 0 refills | Status: AC

## 2023-04-14 MED ORDER — PREDNISONE 20 MG PO TABS: 40.0000 mg | ORAL_TABLET | Freq: Every day | ORAL | 0 refills | Status: DC

## 2023-04-14 NOTE — ED Provider Notes (Signed)
[facility]    CSN: [id number] Arrival date & time: 04/14/23  6962      History   Chief Complaint Chief Complaint  Patient presents with   Rash    HPI Thomas King is a 42 y.o. male.   HPI Patient presents for evaluation of rash of left side that he characterizes as a burning, stinging rash. Reports he was initially experiencing left sided back pain and noticed the eruption of rash two days ago.  Has not had any fever.  Endorses a history of chicken pox outbreak as a child. No history of prior of shingles outbreak.  Denies rash present on any other part of the body.  He has taken ibuprofen for pain minimal relief.    Past Medical History:  Diagnosis Date   Frequent headaches    Hypertension     Patient Active Problem List   Diagnosis Date Noted   Preventative health care 07/28/2018   Essential hypertension 10/01/2014   Hyperlipidemia 10/01/2014   Ulna fracture 09/14/2014   Left knee pain 10/11/2013   Obesity (BMI 30-39.9) 07/29/2013    Past Surgical History:  Procedure Laterality Date   ARTHROSCOPIC REPAIR ACL Left    x's 2        Home Medications    Prior to Admission medications   Medication Sig Start Date End Date Taking? Authorizing Provider  gabapentin (NEURONTIN) 300 MG capsule Take 1 capsule (300 mg total) by mouth 2 (two) times daily as needed for up to 7 days (shingles pain). 04/14/23 04/21/23 Yes Bing Neighbors, NP  predniSONE (DELTASONE) 20 MG tablet Take 2 tablets (40 mg total) by mouth daily with breakfast. 04/14/23  Yes Bing Neighbors, NP  triamcinolone (KENALOG) 0.025 % cream Apply 1 Application topically 3 (three) times daily as needed (rash). 04/14/23  Yes Bing Neighbors, NP  valACYclovir (VALTREX) 1000 MG tablet Take 1 tablet (1,000 mg total) by mouth 3 (three) times daily for 7 days. 04/14/23 04/21/23 Yes Bing Neighbors, NP  amLODipine (NORVASC) 5 MG tablet Take 1 tablet (5 mg total) by mouth daily. 05/07/22   Seabron Spates R, DO  colchicine 0.6 MG tablet Take 1 tablet (0.6 mg total) by mouth daily. Patient not taking: Reported on 04/14/2023 09/05/21   Achille Rich, PA-C  diclofenac (VOLTAREN) 75 MG EC tablet Take 1 tablet (75 mg total) by mouth 2 (two) times daily. Patient not taking: Reported on 04/14/2023 09/17/21   Felecia Shelling, DPM    Family History Family History  Problem Relation Age of Onset   Hypertension Mother     Social History Social History   Tobacco Use   Smoking status: Never   Smokeless tobacco: Never  Vaping Use   Vaping Use: Never used  Substance Use Topics   Alcohol use: Not Currently   Drug use: No     Allergies   Other and Shellfish allergy   Review of Systems Review of Systems Pertinent negatives listed in HPI   Physical Exam Triage Vital Signs ED Triage Vitals  Enc Vitals Group     BP 04/14/23 0847 133/87     Pulse Rate 04/14/23 0847 62     Resp 04/14/23 0847 20     Temp 04/14/23 0847 98.5 F (36.9 C)     Temp Source 04/14/23 0847 Oral     SpO2 04/14/23 0847 93 %     Weight --      Height --  Head Circumference --      Peak Flow --      Pain Score 04/14/23 0845 7     Pain Loc --      Pain Edu? --      Excl. in GC? --    No data found.  Updated Vital Signs BP 133/87 (BP Location: Left Arm) Comment (BP Location): large cuff  Pulse 62   Temp 98.5 F (36.9 C) (Oral)   Resp 20   SpO2 93%   Visual Acuity Right Eye Distance:   Left Eye Distance:   Bilateral Distance:    Right Eye Near:   Left Eye Near:    Bilateral Near:     Physical Exam Vitals reviewed.  Constitutional:      Appearance: Normal appearance.  HENT:     Head: Normocephalic and atraumatic.  Eyes:     Extraocular Movements: Extraocular movements intact.     Pupils: Pupils are equal, round, and reactive to light.  Cardiovascular:     Rate and Rhythm: Normal rate and regular rhythm.  Pulmonary:     Effort: Pulmonary effort is normal.     Breath sounds:  Normal breath sounds.  Musculoskeletal:       Arms:  Skin:    General: Skin is warm.     Capillary Refill: Capillary refill takes less than 2 seconds.  Neurological:     General: No focal deficit present.     Mental Status: He is alert.      UC Treatments / Results  Labs (all labs ordered are listed, but only abnormal results are displayed) Labs Reviewed - No data to display  EKG   Radiology No results found.  Procedures Procedures (including critical care time)  Medications Ordered in UC Medications - No data to display  Initial Impression / Assessment and Plan / UC Course  I have reviewed the triage vital signs and the nursing notes.  Pertinent labs & imaging results that were available during my care of the patient were reviewed by me and considered in my medical decision making (see chart for details).    Herpes zoster shingles rash, per discharge medication orders.  Patient educated that shingles may take up to 2 weeks to completely resolve.  Precautions given.  Work note provided. Final Clinical Impressions(s) / UC Diagnoses   Final diagnoses:  Herpes zoster without complication     Discharge Instructions      Take all medication as prescribed. Remember that shingles can take up to 10-14 days to completely resolve.      ED Prescriptions     Medication Sig Dispense Auth. Provider   valACYclovir (VALTREX) 1000 MG tablet Take 1 tablet (1,000 mg total) by mouth 3 (three) times daily for 7 days. 21 tablet Bing Neighbors, NP   triamcinolone (KENALOG) 0.025 % cream Apply 1 Application topically 3 (three) times daily as needed (rash). 454 g Bing Neighbors, NP   predniSONE (DELTASONE) 20 MG tablet Take 2 tablets (40 mg total) by mouth daily with breakfast. 10 tablet Bing Neighbors, NP   gabapentin (NEURONTIN) 300 MG capsule Take 1 capsule (300 mg total) by mouth 2 (two) times daily as needed for up to 7 days (shingles pain). 20 capsule Bing Neighbors, NP      PDMP not reviewed this encounter.   Bing Neighbors, NP 04/14/23 [phone]

## 2023-04-14 NOTE — ED Triage Notes (Signed)
Rash to left lower back and around left side.  Feels like something is sticking patient .  Bumps noticed 2 days ago.  Reports noticing back pain at the same time.  Reports he had chicken pox as a child.    Patient has taken ibuprofen, but not today

## 2023-04-14 NOTE — Discharge Instructions (Addendum)
Take all medication as prescribed. Remember that shingles can take up to 10-14 days to completely resolve.

## 2023-09-21 ENCOUNTER — Ambulatory Visit
Admission: EM | Admit: 2023-09-21 | Discharge: 2023-09-21 | Disposition: A | Discharge status: Home / Self Care | Payer: BC Managed Care – PPO

## 2023-09-21 VITALS — BP 144/88 | HR 86 | Temp 99.0°F | Resp 20 | Ht 70.0 in | Wt 230.0 lb

## 2023-09-21 DIAGNOSIS — M79672 Pain in left foot: Secondary | ICD-10-CM | POA: Diagnosis not present

## 2023-09-21 DIAGNOSIS — M79671 Pain in right foot: Secondary | ICD-10-CM | POA: Diagnosis not present

## 2023-09-21 DIAGNOSIS — R600 Localized edema: Secondary | ICD-10-CM

## 2023-09-21 MED ORDER — NAPROXEN 500 MG PO TABS: 500.0000 mg | ORAL_TABLET | Freq: Two times a day (BID) | ORAL | 0 refills | Status: DC

## 2023-09-21 NOTE — ED Triage Notes (Signed)
Here with Fiance."Both my ankles (more left) are swollen with pain and swelling feet as well. Seen Podiatry in the past for it (possible arthritis in feet and flat footed).

## 2023-10-04 NOTE — ED Provider Notes (Signed)
[facility]    CSN: [id number] Arrival date & time: 09/21/23  1830      History   Chief Complaint Chief Complaint  Patient presents with   Pain    HPI Thomas King is a 42 y.o. male.   Patient here today for evaluation of swelling and pain in bilateral ankles and feet. HE has had similar in the past and was told he had "flat feet" He does not report treatment for same. He denies any injury. He has not had any other symptoms.   The history is provided by the patient.    Past Medical History:  Diagnosis Date   Frequent headaches    Hypertension     Patient Active Problem List   Diagnosis Date Noted   Penile discharge 06/30/2022   Seborrheic dermatitis of scalp 06/30/2022   Family history of brain aneurysm 05/28/2022   Preventative health care 07/28/2018   Essential hypertension 10/01/2014   Hyperlipidemia 10/01/2014   Ulna fracture 09/14/2014   Left knee pain 10/11/2013   Obesity (BMI 30-39.9) 07/29/2013   Class 1 obesity due to excess calories with serious comorbidity and body mass index (BMI) of 34.0 to 34.9 in adult 07/29/2013    Past Surgical History:  Procedure Laterality Date   ARTHROSCOPIC REPAIR ACL Left    x's 2        Home Medications    Prior to Admission medications   Medication Sig Start Date End Date Taking? Authorizing Provider  ketoconazole (NIZORAL) 2 % shampoo Apply 1 Application topically as directed. 06/30/22  Yes [provider]  MEDROL 4 MG TBPK tablet Take 4 mg by mouth as directed. 03/31/23  Yes [provider]  naproxen (NAPROSYN) 500 MG tablet Take 1 tablet (500 mg total) by mouth 2 (two) times daily. 09/21/23  Yes Tomi Bamberger, PA-C  amLODipine (NORVASC) 10 MG tablet Take 10 mg by mouth daily. 06/30/22   [provider]  amLODipine (NORVASC) 5 MG tablet Take 1 tablet (5 mg total) by mouth daily. 05/07/22   Seabron Spates R, DO  colchicine 0.6 MG tablet Take 1 tablet (0.6 mg total) by  mouth daily. Patient not taking: Reported on 04/14/2023 09/05/21   Achille Rich, PA-C  gabapentin (NEURONTIN) 300 MG capsule Take 1 capsule (300 mg total) by mouth 2 (two) times daily as needed for up to 7 days (shingles pain). 04/14/23 04/21/23  Bing Neighbors, NP  predniSONE (DELTASONE) 20 MG tablet Take 2 tablets (40 mg total) by mouth daily with breakfast. 04/14/23   Bing Neighbors, NP  triamcinolone (KENALOG) 0.025 % cream Apply 1 Application topically 3 (three) times daily as needed (rash). 04/14/23   Bing Neighbors, NP    Family History Family History  Problem Relation Age of Onset   Hypertension Mother     Social History Social History   Tobacco Use   Smoking status: Never   Smokeless tobacco: Never  Vaping Use   Vaping status: Never Used  Substance Use Topics   Alcohol use: Not Currently   Drug use: No     Allergies   Other and Shellfish allergy   Review of Systems Review of Systems  Constitutional:  Negative for chills and fever.  Eyes:  Negative for discharge and redness.  Respiratory:  Negative for shortness of breath.   Cardiovascular:  Positive for leg swelling. Negative for chest pain.  Musculoskeletal:  Positive for arthralgias. Negative for joint swelling.  Skin:  Negative for color change and wound.  Neurological:  Negative for numbness.     Physical Exam Triage Vital Signs ED Triage Vitals  Encounter Vitals Group     BP 09/21/23 1932 (!) 144/88     Systolic BP Percentile --      Diastolic BP Percentile --      Pulse Rate 09/21/23 1932 86     Resp 09/21/23 1932 20     Temp 09/21/23 1932 99 F (37.2 C)     Temp Source 09/21/23 1932 Oral     SpO2 09/21/23 1932 98 %     Weight 09/21/23 1930 230 lb (104.3 kg)     Height 09/21/23 1930 5\' 10"  (1.778 m)     Head Circumference --      Peak Flow --      Pain Score 09/21/23 1925 8     Pain Loc --      Pain Education --      Exclude from Growth Chart --    No data found.  Updated Vital  Signs BP (!) 144/88 (BP Location: Left Arm) Comment: In pain. Will recheck if needed during or after visit.  Pulse 86   Temp 99 F (37.2 C) (Oral)   Resp 20   Ht 5\' 10"  (1.778 m)   Wt 230 lb (104.3 kg)   SpO2 98%   BMI 33.00 kg/m   Visual Acuity Right Eye Distance:   Left Eye Distance:   Bilateral Distance:    Right Eye Near:   Left Eye Near:    Bilateral Near:     Physical Exam Vitals and nursing note reviewed.  Constitutional:      General: He is not in acute distress.    Appearance: Normal appearance. He is not ill-appearing.  HENT:     Head: Normocephalic and atraumatic.  Eyes:     Conjunctiva/sclera: Conjunctivae normal.  Cardiovascular:     Rate and Rhythm: Normal rate.  Pulmonary:     Effort: Pulmonary effort is normal. No respiratory distress.  Musculoskeletal:     Comments: Minimal edema noted to bilateral lower legs/ ankles  Neurological:     Mental Status: He is alert.  Psychiatric:        Mood and Affect: Mood normal.        Behavior: Behavior normal.        Thought Content: Thought content normal.      UC Treatments / Results  Labs (all labs ordered are listed, but only abnormal results are displayed) Labs Reviewed - No data to display  EKG   Radiology No results found.  Procedures Procedures (including critical care time)  Medications Ordered in UC Medications - No data to display  Initial Impression / Assessment and Plan / UC Course  I have reviewed the triage vital signs and the nursing notes.  Pertinent labs & imaging results that were available during my care of the patient were reviewed by me and considered in my medical decision making (see chart for details).    Discussed use of compression stockings, monitoring sodium intake, and ultimately follow up with PCP and podiatry. Patient expresses understanding. Will treat with naproxen for hopeful symptom relief. Advised sooner follow up with any further concerns.   Final Clinical  Impressions(s) / UC Diagnoses   Final diagnoses:  Edema of soft tissue of left ankle region  Bilateral foot pain   Discharge Instructions   None    ED Prescriptions  Medication Sig Dispense Auth. Provider   naproxen (NAPROSYN) 500 MG tablet Take 1 tablet (500 mg total) by mouth 2 (two) times daily. 30 tablet Tomi Bamberger, PA-C      PDMP not reviewed this encounter.   Tomi Bamberger, PA-C 10/04/23 2213

## 2023-10-12 ENCOUNTER — Ambulatory Visit
Admission: EM | Admit: 2023-10-12 | Discharge: 2023-10-12 | Disposition: A | Discharge status: Home / Self Care | Payer: BLUE CROSS/BLUE SHIELD | Attending: Family Medicine | Admitting: Family Medicine

## 2023-10-12 DIAGNOSIS — B9789 Other viral agents as the cause of diseases classified elsewhere: Secondary | ICD-10-CM | POA: Diagnosis present

## 2023-10-12 DIAGNOSIS — J988 Other specified respiratory disorders: Secondary | ICD-10-CM | POA: Insufficient documentation

## 2023-10-12 DIAGNOSIS — Z20822 Contact with and (suspected) exposure to covid-19: Secondary | ICD-10-CM | POA: Diagnosis present

## 2023-10-12 LAB — POCT INFLUENZA A/B
Influenza A, POC: NEGATIVE
Influenza B, POC: NEGATIVE

## 2023-10-12 MED ORDER — PROMETHAZINE-DM 6.25-15 MG/5ML PO SYRP: 5.0000 mL | ORAL_SOLUTION | Freq: Three times a day (TID) | ORAL | 0 refills | Status: DC | PRN

## 2023-10-12 MED ORDER — NAPROXEN 500 MG PO TABS: 500.0000 mg | ORAL_TABLET | Freq: Two times a day (BID) | ORAL | 0 refills | Status: AC | PRN

## 2023-10-12 MED ORDER — ACETAMINOPHEN 325 MG PO TABS
975.0000 mg | ORAL_TABLET | Freq: Once | ORAL | Status: AC
  Administered 2023-10-12: 975 mg via ORAL

## 2023-10-12 NOTE — ED Provider Notes (Signed)
 [facility]    CSN: [id number] Arrival date & time: 10/12/23  0843      History   Chief Complaint Chief Complaint  Patient presents with   Fever   Sore Throat   Generalized Body Aches    HPI Thomas King is a 43 y.o. male.  Presents to the ED with a 1 day history of sore throat, fever, generalized bodyaches and productive cough.   On arrival patient has a fever of 100.6.  Patient denies any difficulty breathing, wheezing or chest tightness. Reports no known  contacts with anyone sick with COVID or flu.  Take any medications for his symptoms.   Past Medical History:  Diagnosis Date   Frequent headaches    Hypertension     Patient Active Problem List   Diagnosis Date Noted   Penile discharge 06/30/2022   Seborrheic dermatitis of scalp 06/30/2022   Family history of brain aneurysm 05/28/2022   Preventative health care 07/28/2018   Essential hypertension 10/01/2014   Hyperlipidemia 10/01/2014   Ulna fracture 09/14/2014   Left knee pain 10/11/2013   Obesity (BMI 30-39.9) 07/29/2013   Class 1 obesity due to excess calories with serious comorbidity and body mass index (BMI) of 34.0 to 34.9 in adult 07/29/2013    Past Surgical History:  Procedure Laterality Date   ARTHROSCOPIC REPAIR ACL Left    x's 2        Home Medications    Prior to Admission medications   Medication Sig Start Date End Date Taking? Authorizing Provider  promethazine -dextromethorphan (PROMETHAZINE -DM) 6.25-15 MG/5ML syrup Take 5 mLs by mouth 3 (three) times daily as needed for cough. 10/12/23  Yes Arloa Suzen RAMAN, NP  amLODipine  (NORVASC ) 10 MG tablet Take 10 mg by mouth daily. 06/30/22   [provider]  amLODipine  (NORVASC ) 5 MG tablet Take 1 tablet (5 mg total) by mouth daily. 05/07/22   Antonio Cyndee Jamee JONELLE, DO  colchicine  0.6 MG tablet Take 1 tablet (0.6 mg total) by mouth daily. Patient not taking: Reported on 04/14/2023 09/05/21   Bernis Ernst, PA-C  gabapentin   (NEURONTIN ) 300 MG capsule Take 1 capsule (300 mg total) by mouth 2 (two) times daily as needed for up to 7 days (shingles pain). 04/14/23 04/21/23  Arloa Suzen RAMAN, NP  ketoconazole (NIZORAL) 2 % shampoo Apply 1 Application topically as directed. 06/30/22   [provider]  MEDROL  4 MG TBPK tablet Take 4 mg by mouth as directed. 03/31/23   [provider]  naproxen  (NAPROSYN ) 500 MG tablet Take 1 tablet (500 mg total) by mouth 2 (two) times daily as needed (fever or body aches). 10/12/23   Arloa Suzen RAMAN, NP  predniSONE  (DELTASONE ) 20 MG tablet Take 2 tablets (40 mg total) by mouth daily with breakfast. 04/14/23   Arloa Suzen RAMAN, NP  triamcinolone  (KENALOG ) 0.025 % cream Apply 1 Application topically 3 (three) times daily as needed (rash). 04/14/23   Arloa Suzen RAMAN, NP    Family History Family History  Problem Relation Age of Onset   Hypertension Mother     Social History Social History   Tobacco Use   Smoking status: Never   Smokeless tobacco: Never  Vaping Use   Vaping status: Never Used  Substance Use Topics   Alcohol use: Not Currently   Drug use: No     Allergies   Other and Shellfish allergy   Review of Systems Review of Systems  Constitutional:  Positive for fever.  Physical Exam Triage Vital Signs ED Triage Vitals  Encounter Vitals Group     BP 10/12/23 0912 (!) 138/95     Systolic BP Percentile --      Diastolic BP Percentile --      Pulse Rate 10/12/23 0912 79     Resp 10/12/23 0912 19     Temp 10/12/23 0912 (!) 100.6 F (38.1 C)     Temp Source 10/12/23 0912 Oral     SpO2 10/12/23 0912 95 %     Weight --      Height --      Head Circumference --      Peak Flow --      Pain Score 10/12/23 0911 9     Pain Loc --      Pain Education --      Exclude from Growth Chart --    No data found.  Updated Vital Signs BP (!) 138/95 (BP Location: Left Arm)   Pulse 79   Temp (!) 100.6 F (38.1 C) (Oral)   Resp 19   SpO2 95%    Visual Acuity Right Eye Distance:   Left Eye Distance:   Bilateral Distance:    Right Eye Near:   Left Eye Near:    Bilateral Near:     Physical Exam   General Appearance:    Alert, cooperative, no distress  HENT:   Normocephalic, ears normal, nares mucosal edema with congestion, rhinorrhea, oropharynx    Eyes:    PERRL, conjunctiva/corneas clear, EOM's intact       Lungs:     Clear to auscultation bilaterally, respirations unlabored  Heart:    Regular rate and rhythm  Neurologic:   Awake, alert, oriented x 3. No apparent focal neurological           defect.     UC Treatments / Results  Labs (all labs ordered are listed, but only abnormal results are displayed) Labs Reviewed  POCT INFLUENZA A/B - Normal  SARS CORONAVIRUS 2 (TAT 6-24 HRS)    EKG   Radiology No results found.  Procedures Procedures (including critical care time)  Medications Ordered in UC Medications  acetaminophen  (TYLENOL ) tablet 975 mg (975 mg Oral Given 10/12/23 0918)    Initial Impression / Assessment and Plan / UC Course  I have reviewed the triage vital signs and the nursing notes.  Pertinent labs & imaging results that were available during my care of the patient were reviewed by me and considered in my medical decision making (see chart for details).    1. Viral respiratory illness (Primary) - acetaminophen  (TYLENOL ) tablet 975 mg - naproxen  (NAPROSYN ) 500 MG tablet; Take 1 tablet (500 mg total) by mouth 2 (two) times daily as needed (fever or body aches).  Dispense: 30 tablet; Refill: 0 - promethazine -dextromethorphan (PROMETHAZINE -DM) 6.25-15 MG/5ML syrup; Take 5 mLs by mouth 3 (three) times daily as needed for cough.  Dispense: 180 mL; Refill: 0  2. Encounter for laboratory testing for COVID-19 virus - SARS CORONAVIRUS 2 (TAT 6-24 HRS) Anterior Nasal Swab; Standing - SARS CORONAVIRUS 2 (TAT 6-24 HRS) Anterior Nasal Swab   Symptoms likely related to a viral illness.  Point-of-care  influenza negative.  PCR COVID test pending Symptom management warranted only.  Manage fever with Tylenol  and ibuprofen .  Nasal symptoms with over-the-counter antihistamines recommended.  Treatment per discharge medications/discharge instructions.  Red flags/ER precautions given. The most current CDC isolation/quarantine recommendation advised.   Final Clinical Impressions(s) / UC  Diagnoses   Final diagnoses:  Viral respiratory illness  Encounter for laboratory testing for COVID-19 virus     Discharge Instructions      Influenza test is negative. COVID test will result within 24 to 48 hours. I have prescribed Promethazine  DM for cough and Naproxen  500 mg she can take twice daily as needed for body aches and fever.      ED Prescriptions     Medication Sig Dispense Auth. Provider   naproxen  (NAPROSYN ) 500 MG tablet Take 1 tablet (500 mg total) by mouth 2 (two) times daily as needed (fever or body aches). 30 tablet Arloa Suzen RAMAN, NP   promethazine -dextromethorphan (PROMETHAZINE -DM) 6.25-15 MG/5ML syrup Take 5 mLs by mouth 3 (three) times daily as needed for cough. 180 mL Arloa Suzen RAMAN, NP      PDMP not reviewed this encounter.   Arloa Suzen RAMAN, NP 10/12/23 1016

## 2023-10-12 NOTE — ED Triage Notes (Addendum)
 Sx started yesterday Fever Sore throat  Body aches  Productive cough

## 2023-10-12 NOTE — Discharge Instructions (Addendum)
 Influenza test is negative. COVID test will result within 24 to 48 hours. I have prescribed Promethazine DM for cough and Naproxen 500 mg she can take twice daily as needed for body aches and fever.

## 2023-10-13 LAB — SARS CORONAVIRUS 2 (TAT 6-24 HRS): SARS Coronavirus 2: NEGATIVE

## 2023-10-29 ENCOUNTER — Encounter: Payer: Self-pay | Admitting: Podiatry

## 2023-10-29 ENCOUNTER — Ambulatory Visit (INDEPENDENT_AMBULATORY_CARE_PROVIDER_SITE_OTHER): Payer: BLUE CROSS/BLUE SHIELD | Admitting: Podiatry

## 2023-10-29 ENCOUNTER — Ambulatory Visit (INDEPENDENT_AMBULATORY_CARE_PROVIDER_SITE_OTHER): Payer: BLUE CROSS/BLUE SHIELD

## 2023-10-29 DIAGNOSIS — M7752 Other enthesopathy of left foot: Secondary | ICD-10-CM

## 2023-10-29 DIAGNOSIS — M76822 Posterior tibial tendinitis, left leg: Secondary | ICD-10-CM

## 2023-10-29 MED ORDER — DICLOFENAC SODIUM 75 MG PO TBEC: 75.0000 mg | DELAYED_RELEASE_TABLET | Freq: Two times a day (BID) | ORAL | 2 refills | Status: DC

## 2023-10-29 MED ORDER — TRIAMCINOLONE ACETONIDE 10 MG/ML IJ SUSP
10.0000 mg | Freq: Once | INTRAMUSCULAR | Status: AC
  Administered 2023-10-29: 10 mg via INTRA_ARTICULAR

## 2023-11-01 NOTE — Progress Notes (Signed)
Subjective:   Patient ID: Thomas King, male   DOB: 43 y.o.   MRN: [id number]   HPI Patient presents with intense discomfort on the inside of the left ankle and states that he is having trouble being able to bear weight down on it and then it has been hurting him for the last couple weeks.  States he does not remember specific injury   ROS      Objective:  Physical Exam  Ocular status intact inflammation pain of the medial compartment consistent with the posterior tibial tendon left with no indication currently of muscle dysfunction.  Very painful when pressed inability to walk on his foot     Assessment:  Acute posterior tibial tendinitis left with possibility of systemic condition or gout       Plan:  H&P reviewed today I went ahead I did sterile prep I injected along the sheath of posterior tibial 3 mg Dexasone Kenalog 5 mg Xylocaine after explaining risk and I then went ahead and applied air fracture walker to completely immobilize and take all weight and pressure off the ankle.  Patient will be seen back 4 weeks will wear this is much as he can also placed on diclofenac  X-rays were negative for signs of fracture or other bony pathology or arthritis around the midtarsal joint subtalar joint

## 2023-11-08 ENCOUNTER — Telehealth: Payer: Self-pay | Admitting: Podiatry

## 2023-11-08 NOTE — Telephone Encounter (Signed)
Patient is requesting an extension on leave of absence, due to swelling of feet. Please contact Patient. [phone]

## 2023-11-08 NOTE — Telephone Encounter (Signed)
That is fine. Can have until he see me on the 7th

## 2023-11-12 ENCOUNTER — Ambulatory Visit (INDEPENDENT_AMBULATORY_CARE_PROVIDER_SITE_OTHER): Payer: BLUE CROSS/BLUE SHIELD | Admitting: Podiatry

## 2023-11-12 DIAGNOSIS — M109 Gout, unspecified: Secondary | ICD-10-CM

## 2023-11-12 DIAGNOSIS — T148XXA Other injury of unspecified body region, initial encounter: Secondary | ICD-10-CM | POA: Diagnosis not present

## 2023-11-12 DIAGNOSIS — M76822 Posterior tibial tendinitis, left leg: Secondary | ICD-10-CM

## 2023-11-12 MED ORDER — PREDNISONE 10 MG PO TABS: ORAL_TABLET | ORAL | 0 refills | Status: DC

## 2023-11-12 NOTE — Progress Notes (Signed)
 Subjective:   Patient ID: Thomas King, male   DOB: 43 y.o.   MRN: [id number]   HPI Patient has significant discomfort in the ankle still and also has swelling of the ankle joint itself with inflammation and failure to respond to immobilization   ROS      Objective:  Physical Exam  Neuro vascular status intact exquisite discomfort noted in the posterior tib as it comes under the medial malleolus and moderate into the ankle joint itself and subtalar joint     Assessment:  Cannot rule out tear or interstitial tear of the posterior tibial tendon with also possibility for subtalar joint ankle arthritis     Plan:  H&P reviewed and at this point I dispensed ankle compression stocking to reduce swelling and I did go ahead and I ordered MRI of the left ankle to rule out tear and arthritis and once we get results we can decide what will be best.  Reviewed what may be required and possible in the future

## 2023-11-14 LAB — ARTHRITIS PANEL
Rheumatoid fact SerPl-aCnc: <10 [IU]/mL (ref <14.0)
Sed Rate: 7 mm/h (ref 0–15)
Uric Acid: 9.3 mg/dL — ABNORMAL HIGH (ref 3.8–8.4)

## 2023-11-16 ENCOUNTER — Telehealth: Payer: Self-pay

## 2023-11-16 NOTE — Telephone Encounter (Signed)
[facility] is out of network.

## 2023-11-22 ENCOUNTER — Encounter: Payer: Self-pay | Admitting: Podiatry

## 2023-11-29 ENCOUNTER — Telehealth: Payer: Self-pay | Admitting: Podiatry

## 2023-11-29 NOTE — Telephone Encounter (Signed)
 Called patient @ [phone] -- LMVM  .... Called to confirm he transitioned back to work --  last appointment was 02/07   ....    J. Abbott -- 11/29/2023

## 2023-12-03 ENCOUNTER — Other Ambulatory Visit: Payer: BLUE CROSS/BLUE SHIELD

## 2023-12-13 ENCOUNTER — Encounter: Payer: Self-pay | Admitting: Podiatry

## 2023-12-13 ENCOUNTER — Telehealth: Payer: Self-pay | Admitting: Podiatry

## 2023-12-13 NOTE — Telephone Encounter (Signed)
 Patient is calling back to speak with Dr. Charlsie Merles regarding MRI results. Patient contact telephone number, [phone]

## 2023-12-15 NOTE — Telephone Encounter (Signed)
 He should see Mcdonald for evaluation. I cannot give him good advice based on his mri but no tear of the tendon we were evaluating

## 2023-12-24 ENCOUNTER — Ambulatory Visit (INDEPENDENT_AMBULATORY_CARE_PROVIDER_SITE_OTHER)

## 2023-12-24 ENCOUNTER — Ambulatory Visit (INDEPENDENT_AMBULATORY_CARE_PROVIDER_SITE_OTHER): Admitting: Podiatry

## 2023-12-24 ENCOUNTER — Encounter: Payer: Self-pay | Admitting: Podiatry

## 2023-12-24 DIAGNOSIS — M25571 Pain in right ankle and joints of right foot: Secondary | ICD-10-CM

## 2023-12-26 NOTE — Progress Notes (Signed)
 Subjective:   Patient ID: Thomas King, male   DOB: 43 y.o.   MRN: [id number]   HPI Patient states his foot is feeling some better but still has quite a bit of pain and wants to review MRI   ROS      Objective:  Physical Exam  Neurovascular status was found to be intact muscle strength adequate range of motion adequate with diminished range of motion of the subtalar joint left and mild to moderate discomfort medial side left ankle but localized with no intense discomfort that was there previously     Assessment:  Inflammatory condition with the probability for a old fracture of the medial malleolus but not sure whether this is contributory to his discomfort     Plan:  H&P reviewed and at this point I have recommended anti-inflammatories and support therapy and ice as needed.  May require surgical intervention eventually

## 2024-02-03 ENCOUNTER — Ambulatory Visit (INDEPENDENT_AMBULATORY_CARE_PROVIDER_SITE_OTHER)

## 2024-02-03 DIAGNOSIS — M2141 Flat foot [pes planus] (acquired), right foot: Secondary | ICD-10-CM

## 2024-02-03 DIAGNOSIS — M7752 Other enthesopathy of left foot: Secondary | ICD-10-CM | POA: Diagnosis not present

## 2024-02-03 DIAGNOSIS — M2142 Flat foot [pes planus] (acquired), left foot: Secondary | ICD-10-CM

## 2024-02-03 DIAGNOSIS — M109 Gout, unspecified: Secondary | ICD-10-CM

## 2024-02-03 DIAGNOSIS — M25571 Pain in right ankle and joints of right foot: Secondary | ICD-10-CM

## 2024-02-03 DIAGNOSIS — M76822 Posterior tibial tendinitis, left leg: Secondary | ICD-10-CM

## 2024-02-03 DIAGNOSIS — T148XXA Other injury of unspecified body region, initial encounter: Secondary | ICD-10-CM

## 2024-02-03 NOTE — Progress Notes (Signed)
 Orthotics   Patient was present and evaluated for Custom molded foot orthotics. Patient will benefit from CFO's to provide total contact to BIL MLA's helping to balance and distribute body weight more evenly across BIL feet helping to reduce plantar pressure and pain. Orthotic will also encourage FF / RF alignment  Patient was scanned today and will return for fitting upon receipt  Financial signed

## 2024-03-17 ENCOUNTER — Ambulatory Visit (INDEPENDENT_AMBULATORY_CARE_PROVIDER_SITE_OTHER): Admitting: Family Medicine

## 2024-03-17 ENCOUNTER — Encounter: Payer: Self-pay | Admitting: Family Medicine

## 2024-03-17 VITALS — BP 120/90 | HR 82 | Temp 98.1°F | Resp 18 | Ht 70.0 in | Wt 240.4 lb

## 2024-03-17 DIAGNOSIS — I10 Essential (primary) hypertension: Secondary | ICD-10-CM | POA: Diagnosis not present

## 2024-03-17 DIAGNOSIS — Z202 Contact with and (suspected) exposure to infections with a predominantly sexual mode of transmission: Secondary | ICD-10-CM

## 2024-03-17 DIAGNOSIS — Z0000 Encounter for general adult medical examination without abnormal findings: Secondary | ICD-10-CM | POA: Diagnosis not present

## 2024-03-17 DIAGNOSIS — G8929 Other chronic pain: Secondary | ICD-10-CM

## 2024-03-17 DIAGNOSIS — L219 Seborrheic dermatitis, unspecified: Secondary | ICD-10-CM | POA: Diagnosis not present

## 2024-03-17 DIAGNOSIS — M25562 Pain in left knee: Secondary | ICD-10-CM

## 2024-03-17 MED ORDER — AMLODIPINE BESYLATE 10 MG PO TABS: 10.0000 mg | ORAL_TABLET | Freq: Every day | ORAL | 1 refills | Status: DC

## 2024-03-17 MED ORDER — CICLOPIROX 1 % EX SHAM: MEDICATED_SHAMPOO | CUTANEOUS | 0 refills | Status: DC

## 2024-03-17 NOTE — Progress Notes (Signed)
 Established Patient Office Visit  Subjective   Patient ID: Thomas King, male    DOB: [birth year]  Age: 43 y.o. MRN: [id number]  Chief Complaint  Patient presents with   Annual Exam    Pt states fasting     HPI Discussed the use of AI scribe software for clinical note transcription with the patient, who gave verbal consent to proceed.  History of Present Illness   Thomas King is a 43 year old male who presents for a physical exam and evaluation of foot and knee issues.  He has a history of foot swelling and pain, severe enough to require a boot, leg brace, and crutches. He has been under the care of a podiatrist who diagnosed a ligament tear in his foot. He received a steroid injection and was prescribed shoe inserts. Despite these interventions, symptoms persist.  He experiences knee swelling and has a history of two ACL tears, both occurring while playing basketball. The first ACL tear was treated with arthroscopic surgery, and the second required a graft from his hamstring. He mentions inadequate physical therapy during recovery due to being in prison at the time, which may have impacted his rehabilitation.  He describes having scabs on his scalp that bleed when scratched. He was previously prescribed a ketoconazole shampoo, which has not been effective.  He is interested in STD testing, noting that he is not experiencing symptoms,      Patient Active Problem List   Diagnosis Date Noted   Penile discharge 06/30/2022   Seborrheic dermatitis of scalp 06/30/2022   Family history of brain aneurysm 05/28/2022   Preventative health care 07/28/2018   Essential hypertension 10/01/2014   Hyperlipidemia 10/01/2014   Ulna fracture 09/14/2014   Left knee pain 10/11/2013   Obesity (BMI 30-39.9) 07/29/2013   Class 1 obesity due to excess calories with serious comorbidity and body mass index (BMI) of 34.0 to 34.9 in adult 07/29/2013   Past Medical History:  Diagnosis Date   Frequent  headaches    Hypertension    Past Surgical History:  Procedure Laterality Date   ARTHROSCOPIC REPAIR ACL Left    x's 2    Social History   Tobacco Use   Smoking status: Never   Smokeless tobacco: Never  Vaping Use   Vaping status: Never Used  Substance Use Topics   Alcohol use: Not Currently   Drug use: No   Social History   Socioeconomic History   Marital status: Single    Spouse name: Not on file   Number of children: Not on file   Years of education: Not on file   Highest education level: Not on file  Occupational History    Employer: [employer]    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  Tobacco Use   Smoking status: Never   Smokeless tobacco: Never  Vaping Use   Vaping status: Never Used  Substance and Sexual Activity   Alcohol use: Not Currently   Drug use: No   Sexual activity: Yes    Partners: Female    Birth control/protection: Condom  Other Topics Concern   Not on file  Social History Narrative   Exercise-- occassional   Social Drivers of Health   Financial Resource Strain: Not on file  Food Insecurity: Not on file  Transportation Needs: Not on file  Physical Activity: Not on file  Stress: Not on file  Social Connections: Not on file  Intimate Partner Violence: Not At Risk (10/25/2023)  Received from [facility]   HITS    Over the last 12 months how often did your partner physically hurt you?: Never    Over the last 12 months how often did your partner insult you or talk down to you?: Never    Over the last 12 months how often did your partner threaten you with physical harm?: Never    Over the last 12 months how often did your partner scream or curse at you?: Never   Family Status  Relation Name Status   Mother  Alive   Father  Deceased       health hx unknown   Sister  Alive   Sister  Alive   Brother  Alive  No partnership data on file   Family History  Problem Relation Age of Onset   Hypertension Mother    Allergies  Allergen  Reactions   Other Shortness Of Breath and Rash    ALL SEAFOOD   Shellfish Allergy Rash, Shortness Of Breath and Hives      Review of Systems  Constitutional:  Negative for fever and malaise/fatigue.  HENT:  Negative for congestion.   Eyes:  Negative for blurred vision.  Respiratory:  Negative for cough and shortness of breath.   Cardiovascular:  Negative for chest pain, palpitations and leg swelling.  Gastrointestinal:  Negative for vomiting.  Musculoskeletal:  Negative for back pain.  Skin:  Negative for rash.  Neurological:  Negative for loss of consciousness and headaches.      Objective:     BP (!) 120/90 (BP Location: Left Arm, Patient Position: Sitting, Cuff Size: Large)   Pulse 82   Temp 98.1 F (36.7 C) (Oral)   Resp 18   Ht 5' 10 (1.778 m)   Wt 240 lb 6.4 oz (109 kg)   SpO2 97%   BMI 34.49 kg/m  BP Readings from Last 3 Encounters:  03/17/24 (!) 120/90  10/12/23 (!) 138/95  09/21/23 (!) 144/88   Wt Readings from Last 3 Encounters:  03/17/24 240 lb 6.4 oz (109 kg)  09/21/23 230 lb (104.3 kg)  05/07/22 241 lb 6.4 oz (109.5 kg)   SpO2 Readings from Last 3 Encounters:  03/17/24 97%  10/12/23 95%  09/21/23 98%      Physical Exam Vitals and nursing note reviewed.  Constitutional:      General: He is not in acute distress.    Appearance: Normal appearance. He is well-developed.  HENT:     Head: Normocephalic and atraumatic.     Right Ear: Tympanic membrane, ear canal and external ear normal. There is no impacted cerumen.     Left Ear: Tympanic membrane, ear canal and external ear normal. There is no impacted cerumen.     Nose: Nose normal.     Mouth/Throat:     Mouth: Mucous membranes are moist.     Pharynx: Oropharynx is clear. No oropharyngeal exudate or posterior oropharyngeal erythema.   Eyes:     General: No scleral icterus.       Right eye: No discharge.        Left eye: No discharge.     Conjunctiva/sclera: Conjunctivae normal.     Pupils:  Pupils are equal, round, and reactive to light.   Neck:     Thyroid : No thyromegaly.     Vascular: No JVD.   Cardiovascular:     Rate and Rhythm: Normal rate and regular rhythm.     Heart sounds: Normal heart sounds.  No murmur heard. Pulmonary:     Effort: Pulmonary effort is normal. No respiratory distress.     Breath sounds: Normal breath sounds.  Abdominal:     General: Bowel sounds are normal. There is no distension.     Palpations: Abdomen is soft. There is no mass.     Tenderness: There is no abdominal tenderness. There is no guarding or rebound.   Musculoskeletal:        General: Normal range of motion.     Cervical back: Normal range of motion and neck supple.     Right lower leg: No edema.     Left lower leg: No edema.  Lymphadenopathy:     Cervical: No cervical adenopathy.   Skin:    General: Skin is warm and dry.     Findings: No erythema or rash.   Neurological:     Mental Status: He is alert and oriented to person, place, and time.     Cranial Nerves: No cranial nerve deficit.     Motor: No abnormal muscle tone.     Deep Tendon Reflexes: Reflexes are normal and symmetric. Reflexes normal.   Psychiatric:        Mood and Affect: Mood normal.        Behavior: Behavior normal.        Thought Content: Thought content normal.        Judgment: Judgment normal.      No results found for any visits on 03/17/24.  Last CBC Lab Results  Component Value Date   WBC 7.5 05/07/2022   HGB 14.3 05/07/2022   HCT 42.8 05/07/2022   MCV 91.4 05/07/2022   RDW 13.3 05/07/2022   PLT 263.0 05/07/2022   Last metabolic panel Lab Results  Component Value Date   GLUCOSE 71 05/07/2022   NA 139 05/07/2022   K 4.3 05/07/2022   CL 103 05/07/2022   CO2 31 05/07/2022   BUN 8 05/07/2022   CREATININE 1.23 05/07/2022   GFR 73.05 05/07/2022   CALCIUM 9.4 05/07/2022   PROT 7.5 05/07/2022   ALBUMIN 4.4 05/07/2022   BILITOT 0.4 05/07/2022   ALKPHOS 101 05/07/2022   AST 23  05/07/2022   ALT 28 05/07/2022   Last lipids Lab Results  Component Value Date   CHOL 149 05/07/2022   HDL 24.70 (L) 05/07/2022   LDLCALC 67 06/07/2018   LDLDIRECT 89.0 05/07/2022   TRIG 313.0 (H) 05/07/2022   CHOLHDL 6 05/07/2022   Last hemoglobin A1c No results found for: HGBA1C Last thyroid  functions Lab Results  Component Value Date   TSH 0.98 05/07/2022   Last vitamin D No results found for: 25OHVITD2, 25OHVITD3, VD25OH Last vitamin B12 and Folate No results found for: VITAMINB12, FOLATE    The 10-year ASCVD risk score (Arnett DK, et al., 2019) is: 6.2%    Assessment & Plan:   Problem List Items Addressed This Visit       Unprioritized   Left knee pain   Relevant Orders   Ambulatory referral to Orthopedic Surgery   Essential hypertension   Relevant Medications   amLODipine  (NORVASC ) 10 MG tablet   Preventative health care - Primary   Ghm utd Check labs  See AVS Health Maintenance  Topic Date Due   HPV VACCINES (1 - Male 3-dose series) Never done   DTaP/Tdap/Td (2 - Td or Tdap) 10/05/2020   COVID-19 Vaccine (1 - 2024-25 season) Never done   INFLUENZA VACCINE  05/05/2024   Hepatitis  C Screening  Completed   HIV Screening  Completed   Meningococcal B Vaccine  Aged Out         Relevant Orders   Lipid panel   PSA   TSH   Comprehensive metabolic panel with GFR   CBC with Differential/Platelet   Other Visit Diagnoses       Seborrhea       Relevant Medications   Ciclopirox 1 % shampoo   Other Relevant Orders   Ambulatory referral to Dermatology     Possible exposure to STD       Relevant Orders   RPR   HSV(herpes simplex vrs) 1+2 ab-IgG   HepB+HepC+HIV Panel     Assessment and Plan    Foot Ligament Tear   He has a ligament tear in his foot, causing significant swelling and difficulty ambulating. Treatment includes a steroid injection and a boot. If the condition does not improve, surgical intervention may be necessary. He uses  crutches and has a handicap placard due to mobility issues. Issue a new handicap placard if needed.  Knee Pain and ACL Tear   He has bilateral ACL tears with previous surgeries and inadequate physical therapy during recovery, resulting in persistent knee pain. Further ACL repair may not be feasible, potentially necessitating knee arthroplasty. He plans to consult a specialist at [facility] for further evaluation. Refer to Gilberto Labella for knee evaluation.  Scalp Condition   He reports scabs on his scalp that bleed upon scratching. Previous ketoconazole shampoo treatment was ineffective. A dermatological condition is suspected, and a referral to dermatology is planned. Prescribe a new medicated shampoo for scalp and instruct him to massage shampoo into scalp, let sit for 5 minutes, and rinse, 2-3 times a week. Refer to dermatology.  Hypertension   He requires medication management for hypertension. His antihypertensive medication was refilled to ensure continued control.  STD Screening   He requests STD testing. Chlamydia and gonorrhea testing require a penile swab, which he may decline if asymptomatic. Other STD tests can be performed via serology. Perform STD testing via serology.  General Health Maintenance   He is due for a tetanus booster and has not had recent ophthalmologic evaluation. Regular eye exams are advised given his age. Administer tetanus booster and advise regular ophthalmologic evaluations.  Follow-up   He will be contacted by the referral department for appointments with dermatology and Gilberto Labella. He is advised to update his contact information to ensure communication. Ensure referral department contacts him for dermatology and Gilberto Labella appointments and update contact information.        Return in about 6 months (around 09/16/2024), or if symptoms worsen or fail to improve.    Jenniah Bhavsar R Lowne Chase, DO

## 2024-03-17 NOTE — Patient Instructions (Signed)

## 2024-03-17 NOTE — Assessment & Plan Note (Signed)
 Ghm utd Check labs  See AVS Health Maintenance  Topic Date Due   HPV VACCINES (1 - Male 3-dose series) Never done   DTaP/Tdap/Td (2 - Td or Tdap) 10/05/2020   COVID-19 Vaccine (1 - 2024-25 season) Never done   INFLUENZA VACCINE  05/05/2024   Hepatitis C Screening  Completed   HIV Screening  Completed   Meningococcal B Vaccine  Aged Out

## 2024-03-18 LAB — COMPREHENSIVE METABOLIC PANEL WITH GFR
AG Ratio: 1.3 (calc) (ref 1.0–2.5)
ALT: 26 U/L (ref 9–46)
AST: 23 U/L (ref 10–40)
Albumin: 4.3 g/dL (ref 3.6–5.1)
Alkaline phosphatase (APISO): 100 U/L (ref 36–130)
BUN: 8 mg/dL (ref 7–25)
CO2: 26 mmol/L (ref 20–32)
Calcium: 9.6 mg/dL (ref 8.6–10.3)
Chloride: 104 mmol/L (ref 98–110)
Creat: 1.27 mg/dL (ref 0.60–1.29)
Globulin: 3.2 g/dL (ref 1.9–3.7)
Glucose, Bld: 91 mg/dL (ref 65–99)
Potassium: 4.1 mmol/L (ref 3.5–5.3)
Sodium: 139 mmol/L (ref 135–146)
Total Bilirubin: 0.6 mg/dL (ref 0.2–1.2)
Total Protein: 7.5 g/dL (ref 6.1–8.1)
eGFR: 72 mL/min/{1.73_m2} (ref >=60)

## 2024-03-18 LAB — HSV(HERPES SIMPLEX VRS) I + II AB-IGG
HSV 1 IGG,TYPE SPECIFIC AB: <0.9 {index}
HSV 2 IGG,TYPE SPECIFIC AB: <0.9 {index}

## 2024-03-18 LAB — CBC WITH DIFFERENTIAL/PLATELET
Absolute Lymphocytes: 1758 {cells}/uL (ref 850–3900)
Absolute Monocytes: 391 {cells}/uL (ref 200–950)
Basophils Absolute: 38 {cells}/uL (ref 0–200)
Basophils Relative: 0.6 %
Eosinophils Absolute: 397 {cells}/uL (ref 15–500)
Eosinophils Relative: 6.3 %
HCT: 46.4 % (ref 38.5–50.0)
Hemoglobin: 14.8 g/dL (ref 13.2–17.1)
MCH: 29.2 pg (ref 27.0–33.0)
MCHC: 31.9 g/dL — ABNORMAL LOW (ref 32.0–36.0)
MCV: 91.7 fL (ref 80.0–100.0)
MPV: 11.5 fL (ref 7.5–12.5)
Monocytes Relative: 6.2 %
Neutro Abs: 3717 {cells}/uL (ref 1500–7800)
Neutrophils Relative %: 59 %
Platelets: 270 10*3/uL (ref 140–400)
RBC: 5.06 10*6/uL (ref 4.20–5.80)
RDW: 12.5 % (ref 11.0–15.0)
Total Lymphocyte: 27.9 %
WBC: 6.3 10*3/uL (ref 3.8–10.8)

## 2024-03-18 LAB — LIPID PANEL
Cholesterol: 164 mg/dL (ref <200)
HDL: 29 mg/dL — ABNORMAL LOW (ref >=40)
LDL Cholesterol (Calc): 100 mg/dL — ABNORMAL HIGH
Non-HDL Cholesterol (Calc): 135 mg/dL — ABNORMAL HIGH (ref <130)
Total CHOL/HDL Ratio: 5.7 (calc) — ABNORMAL HIGH (ref <5.0)
Triglycerides: 234 mg/dL — ABNORMAL HIGH (ref <150)

## 2024-03-18 LAB — HEPB+HEPC+HIV PANEL
HIV Screen 4th Generation wRfx: NONREACTIVE
Hep B C IgM: NEGATIVE
Hep B Core Total Ab: NEGATIVE
Hep B E Ab: NONREACTIVE
Hep B E Ag: NEGATIVE
Hep B Surface Ab, Qual: REACTIVE
Hep C Virus Ab: NONREACTIVE
Hepatitis B Surface Ag: NEGATIVE

## 2024-03-18 LAB — RPR: RPR Ser Ql: NONREACTIVE

## 2024-03-18 LAB — PSA: PSA: 0.52 ng/mL (ref <=4.00)

## 2024-03-18 LAB — TSH: TSH: 1.18 m[IU]/L (ref 0.40–4.50)

## 2024-03-21 ENCOUNTER — Ambulatory Visit

## 2024-03-21 NOTE — Progress Notes (Signed)
 Patient presents today to pick up custom molded foot orthotics, diagnosed with Pes planus by Dr. Celia Coles.   Orthotics were dispensed and fit was satisfactory. Reviewed instructions for break-in and wear. Written instructions given to patient.  Patient will follow up as needed.   Britton Cane Cped, CFo, CFm

## 2024-03-25 ENCOUNTER — Ambulatory Visit: Payer: Self-pay | Admitting: Family Medicine

## 2024-04-03 ENCOUNTER — Encounter: Payer: Self-pay | Admitting: Emergency Medicine

## 2024-04-03 ENCOUNTER — Ambulatory Visit: Admission: EM | Admit: 2024-04-03 | Discharge: 2024-04-03 | Disposition: A | Discharge status: Home / Self Care

## 2024-04-03 DIAGNOSIS — M25522 Pain in left elbow: Secondary | ICD-10-CM

## 2024-04-03 DIAGNOSIS — M7022 Olecranon bursitis, left elbow: Secondary | ICD-10-CM

## 2024-04-03 DIAGNOSIS — G8929 Other chronic pain: Secondary | ICD-10-CM | POA: Diagnosis not present

## 2024-04-03 MED ORDER — PREDNISONE 20 MG PO TABS
40.0000 mg | ORAL_TABLET | Freq: Every day | ORAL | 0 refills | Status: AC
Start: 2024-04-03 — End: 2024-04-08

## 2024-04-03 NOTE — ED Triage Notes (Signed)
 Pt reports L elbow pain x10 days. Has seen murphy wainer regarding the pain in this time and was given meloxicam that did not improve pain or swelling. Pt notes no injury just woke up with pain and swelling in joint. Pt states they told me something about bone spurs in my elbow. Has applied ice with mild relief. Pt has not followed up with the orthopedic practice since initial visit.

## 2024-04-03 NOTE — ED Provider Notes (Signed)
 [facility]    CSN: [id number] Arrival date & time: 04/03/24  0840      History   Chief Complaint Chief Complaint  Patient presents with   Elbow Pain    HPI Thomas King is a 43 y.o. male.   Patient reports left elbow pain has had the last 10 days.  He reports that there was swelling but this is improved somewhat with ice.  He was also prescribed meloxicam but states this has not improved symptoms.  He reports that he has not had any injury to his elbow but was told he may have broken bone spurs in his elbow with Ortho in the past.  He has not seen them recently for this concern.  The history is provided by the patient.    Past Medical History:  Diagnosis Date   Frequent headaches    Hypertension     Patient Active Problem List   Diagnosis Date Noted   Penile discharge 06/30/2022   Seborrheic dermatitis of scalp 06/30/2022   Family history of brain aneurysm 05/28/2022   Preventative health care 07/28/2018   Essential hypertension 10/01/2014   Hyperlipidemia 10/01/2014   Ulna fracture 09/14/2014   Left knee pain 10/11/2013   Obesity (BMI 30-39.9) 07/29/2013   Class 1 obesity due to excess calories with serious comorbidity and body mass index (BMI) of 34.0 to 34.9 in adult 07/29/2013    Past Surgical History:  Procedure Laterality Date   ARTHROSCOPIC REPAIR ACL Left    x's 2        Home Medications    Prior to Admission medications   Medication Sig Start Date End Date Taking? Authorizing Provider  amLODipine  (NORVASC ) 10 MG tablet Take 1 tablet (10 mg total) by mouth daily. 03/17/24  Yes Antonio Cyndee Rockers R, DO  meloxicam (MOBIC) 15 MG tablet Take 15 mg by mouth daily. 03/24/24  Yes [provider]  predniSONE  (DELTASONE ) 20 MG tablet Take 2 tablets (40 mg total) by mouth daily with breakfast for 5 days. 04/03/24 04/08/24 Yes Billy Asberry FALCON, PA-C  Ciclopirox  1 % shampoo Massage into scalp , wait 5 min then rinse, 2-3 x a week 03/17/24    Antonio Cyndee, Rockers SAUNDERS, DO  diclofenac  (VOLTAREN ) 75 MG EC tablet Take 1 tablet (75 mg total) by mouth 2 (two) times daily. Patient not taking: Reported on 04/03/2024 10/29/23   Magdalen Pasco RAMAN, DPM  ketoconazole (NIZORAL) 2 % shampoo Apply 1 Application topically as directed. Patient not taking: Reported on 04/03/2024 06/30/22   [provider]  naproxen  (NAPROSYN ) 500 MG tablet Take 1 tablet (500 mg total) by mouth 2 (two) times daily as needed (fever or body aches). Patient not taking: Reported on 04/03/2024 10/12/23   Arloa Suzen RAMAN, NP    Family History Family History  Problem Relation Age of Onset   Hypertension Mother     Social History Social History   Tobacco Use   Smoking status: Never   Smokeless tobacco: Never  Vaping Use   Vaping status: Never Used  Substance Use Topics   Alcohol use: Not Currently   Drug use: No     Allergies   Other and Shellfish allergy   Review of Systems Review of Systems  Constitutional:  Negative for chills and fever.  Eyes:  Negative for discharge and redness.  Respiratory:  Negative for shortness of breath.   Musculoskeletal:  Positive for arthralgias and joint swelling.  Skin:  Negative for color change  and wound.  Neurological:  Negative for numbness.     Physical Exam Triage Vital Signs ED Triage Vitals  Encounter Vitals Group     BP 04/03/24 0903 135/86     Girls Systolic BP Percentile --      Girls Diastolic BP Percentile --      Boys Systolic BP Percentile --      Boys Diastolic BP Percentile --      Pulse Rate 04/03/24 0903 66     Resp 04/03/24 0903 16     Temp 04/03/24 0903 98.6 F (37 C)     Temp Source 04/03/24 0903 Oral     SpO2 04/03/24 0903 95 %     Weight --      Height --      Head Circumference --      Peak Flow --      Pain Score 04/03/24 0904 7     Pain Loc --      Pain Education --      Exclude from Growth Chart --    No data found.  Updated Vital Signs BP 135/86 (BP Location: Left  Arm)   Pulse 66   Temp 98.6 F (37 C) (Oral)   Resp 16   SpO2 95%   Visual Acuity Right Eye Distance:   Left Eye Distance:   Bilateral Distance:    Right Eye Near:   Left Eye Near:    Bilateral Near:     Physical Exam Vitals and nursing note reviewed.  Constitutional:      General: He is not in acute distress.    Appearance: Normal appearance. He is not ill-appearing.  HENT:     Head: Normocephalic and atraumatic.   Eyes:     Conjunctiva/sclera: Conjunctivae normal.    Cardiovascular:     Rate and Rhythm: Normal rate.  Pulmonary:     Effort: Pulmonary effort is normal. No respiratory distress.   Musculoskeletal:     Comments: Decreased range of motion of left elbow due to pain and swelling.  Normal range of motion of left wrist.  Swelling of left elbow present to olecranon process.  Compressible swelling noted.  Area is tender to palpation.  There is no erythema or warmth.   Neurological:     Mental Status: He is alert.     Comments: Gross sensation intact to distal left fingers  Psychiatric:        Mood and Affect: Mood normal.        Behavior: Behavior normal.        Thought Content: Thought content normal.      UC Treatments / Results  Labs (all labs ordered are listed, but only abnormal results are displayed) Labs Reviewed - No data to display  EKG   Radiology No results found.  Procedures Procedures (including critical care time)  Medications Ordered in UC Medications - No data to display  Initial Impression / Assessment and Plan / UC Course  I have reviewed the triage vital signs and the nursing notes.  Pertinent labs & imaging results that were available during my care of the patient were reviewed by me and considered in my medical decision making (see chart for details).    Suspect olecranon bursitis and will treat with steroid burst.  Advised patient to continue using compression sleeve he reports that he has purchased for standing.   Advised follow-up with Ortho as soon as possible especially given chronic elbow pain at baseline.  Patient expressed understanding.  Final Clinical Impressions(s) / UC Diagnoses   Final diagnoses:  Chronic elbow pain, left  Olecranon bursitis of left elbow     Discharge Instructions       Please follow up with ortho as soon as possible regarding elbow pain.       ED Prescriptions     Medication Sig Dispense Auth. Provider   predniSONE  (DELTASONE ) 20 MG tablet Take 2 tablets (40 mg total) by mouth daily with breakfast for 5 days. 10 tablet Billy Asberry FALCON, PA-C      PDMP not reviewed this encounter.   Billy Asberry FALCON, PA-C 04/03/24 [phone]

## 2024-04-03 NOTE — Discharge Instructions (Signed)
  Please follow up with ortho as soon as possible regarding elbow pain.

## 2024-05-03 ENCOUNTER — Encounter: Payer: Self-pay | Admitting: Emergency Medicine

## 2024-05-03 ENCOUNTER — Ambulatory Visit
Admission: EM | Admit: 2024-05-03 | Discharge: 2024-05-03 | Disposition: A | Discharge status: Home / Self Care | Attending: Physician Assistant | Admitting: Physician Assistant

## 2024-05-03 DIAGNOSIS — M25571 Pain in right ankle and joints of right foot: Secondary | ICD-10-CM | POA: Diagnosis not present

## 2024-05-03 MED ORDER — PREDNISONE 20 MG PO TABS
40.0000 mg | ORAL_TABLET | Freq: Every day | ORAL | 0 refills | Status: AC
Start: 2024-05-03 — End: 2024-05-08

## 2024-05-03 NOTE — ED Provider Notes (Addendum)
 [facility]    CSN: [id number] Arrival date & time: 05/03/24  1622      History   Chief Complaint Chief Complaint  Patient presents with   Ankle Pain    HPI Thomas King is a 43 y.o. male.   Patient presents today for evaluation of right ankle pain that he woke with 2 nights ago.  He reports that pain is also present to his heel when he applies pressure to the area when walking.  He notes that this is a stabbing type pain.  Ankle pain is present to both lateral and medial malleolus with some associated swelling.  He states that similar symptoms have occurred in his left ankle in the past and he was prescribed steroids with some relief.  He is requesting same as NSAIDs, heat ice and elevation have not improved symptoms.  He notes he does have appointment upcoming with specialist later this week.  He denies any known injury.  He denies any numbness or tingling.  The history is provided by the patient.  Ankle Pain Associated symptoms: no fever     Past Medical History:  Diagnosis Date   Frequent headaches    Hypertension     Patient Active Problem List   Diagnosis Date Noted   Penile discharge 06/30/2022   Seborrheic dermatitis of scalp 06/30/2022   Family history of brain aneurysm 05/28/2022   Preventative health care 07/28/2018   Essential hypertension 10/01/2014   Hyperlipidemia 10/01/2014   Ulna fracture 09/14/2014   Left knee pain 10/11/2013   Obesity (BMI 30-39.9) 07/29/2013   Class 1 obesity due to excess calories with serious comorbidity and body mass index (BMI) of 34.0 to 34.9 in adult 07/29/2013    Past Surgical History:  Procedure Laterality Date   ARTHROSCOPIC REPAIR ACL Left    x's 2        Home Medications    Prior to Admission medications   Medication Sig Start Date End Date Taking? Authorizing Provider  amLODipine  (NORVASC ) 10 MG tablet Take 1 tablet (10 mg total) by mouth daily. 03/17/24  Yes Antonio Cyndee Rockers R, DO  predniSONE   (DELTASONE ) 20 MG tablet Take 2 tablets (40 mg total) by mouth daily with breakfast for 5 days. 05/03/24 05/08/24 Yes Billy Asberry FALCON, PA-C  Ciclopirox  1 % shampoo Massage into scalp , wait 5 min then rinse, 2-3 x a week 03/17/24   Antonio Cyndee, Rockers SAUNDERS, DO  diclofenac  (VOLTAREN ) 75 MG EC tablet Take 1 tablet (75 mg total) by mouth 2 (two) times daily. Patient not taking: Reported on 04/03/2024 10/29/23   Magdalen Pasco RAMAN, DPM  ketoconazole (NIZORAL) 2 % shampoo Apply 1 Application topically as directed. Patient not taking: Reported on 04/03/2024 06/30/22   [provider]  meloxicam (MOBIC) 15 MG tablet Take 15 mg by mouth daily. Patient not taking: Reported on 05/03/2024 03/24/24   [provider]  naproxen  (NAPROSYN ) 500 MG tablet Take 1 tablet (500 mg total) by mouth 2 (two) times daily as needed (fever or body aches). Patient not taking: Reported on 04/03/2024 10/12/23   Arloa Suzen RAMAN, NP    Family History Family History  Problem Relation Age of Onset   Hypertension Mother     Social History Social History   Tobacco Use   Smoking status: Never   Smokeless tobacco: Never  Vaping Use   Vaping status: Never Used  Substance Use Topics   Alcohol use: Not Currently   Drug use:  No     Allergies   Other and Shellfish allergy   Review of Systems Review of Systems  Constitutional:  Negative for chills and fever.  Eyes:  Negative for discharge and redness.  Respiratory:  Negative for shortness of breath.   Musculoskeletal:  Positive for arthralgias and joint swelling.  Skin:  Negative for color change and wound.  Neurological:  Negative for numbness.     Physical Exam Triage Vital Signs ED Triage Vitals  Encounter Vitals Group     BP 05/03/24 1651 (!) 128/91     Girls Systolic BP Percentile --      Girls Diastolic BP Percentile --      Boys Systolic BP Percentile --      Boys Diastolic BP Percentile --      Pulse Rate 05/03/24 1651 91     Resp 05/03/24  1651 18     Temp 05/03/24 1651 98.9 F (37.2 C)     Temp Source 05/03/24 1651 Oral     SpO2 05/03/24 1651 96 %     Weight --      Height --      Head Circumference --      Peak Flow --      Pain Score 05/03/24 1652 10     Pain Loc --      Pain Education --      Exclude from Growth Chart --    No data found.  Updated Vital Signs BP (!) 128/91 (BP Location: Right Arm)   Pulse 91   Temp 98.9 F (37.2 C) (Oral)   Resp 18   SpO2 96%   Visual Acuity Right Eye Distance:   Left Eye Distance:   Bilateral Distance:    Right Eye Near:   Left Eye Near:    Bilateral Near:     Physical Exam Vitals and nursing note reviewed.  Constitutional:      General: He is not in acute distress.    Appearance: Normal appearance. He is not ill-appearing.  HENT:     Head: Normocephalic and atraumatic.  Eyes:     Conjunctiva/sclera: Conjunctivae normal.  Cardiovascular:     Rate and Rhythm: Normal rate.  Pulmonary:     Effort: Pulmonary effort is normal. No respiratory distress.  Musculoskeletal:     Comments: Full range of motion of right ankle with tenderness palpation noted to both posterior lateral and posterior medial malleolus.  Mild swelling appreciated to ankle diffusely.  No erythema or warmth.  No swelling to right lower leg.  Neurological:     Mental Status: He is alert.  Psychiatric:        Mood and Affect: Mood normal.        Behavior: Behavior normal.        Thought Content: Thought content normal.      UC Treatments / Results  Labs (all labs ordered are listed, but only abnormal results are displayed) Labs Reviewed - No data to display  EKG   Radiology No results found.  Procedures Procedures (including critical care time)  Medications Ordered in UC Medications - No data to display  Initial Impression / Assessment and Plan / UC Course  I have reviewed the triage vital signs and the nursing notes.  Pertinent labs & imaging results that were available  during my care of the patient were reviewed by me and considered in my medical decision making (see chart for details).    Suspect likely inflammatory cause  of symptoms and will treat with steroid burst.  Advised if there is some concern for recurrent steroid use given prescription for same a month ago for elbow pain.  Discussed possibility of plantar fasciitis as well given heel pain.  Advise follow-up with specialist as previously scheduled or sooner with any worsening or further concerns. Encouraged compression wrap to help with swelling as well.  Patient expressed understanding.  Final Clinical Impressions(s) / UC Diagnoses   Final diagnoses:  Acute right ankle pain   Discharge Instructions   None    ED Prescriptions     Medication Sig Dispense Auth. Provider   predniSONE  (DELTASONE ) 20 MG tablet Take 2 tablets (40 mg total) by mouth daily with breakfast for 5 days. 10 tablet Billy Asberry FALCON, PA-C      PDMP not reviewed this encounter.   Billy Asberry FALCON, PA-C 05/03/24 1720    Billy Asberry FALCON, PA-C 05/03/24 [phone]

## 2024-05-03 NOTE — ED Triage Notes (Signed)
 Pt reports R ankle pain x2 days. No injury. Woke up with the pain at night and it has been constant since. Reports stabbing sensation in the heel that gets worse with pressure applied when walking Pt notes slight swelling in the last 2 days. Hx of bone spurs noted by pt. Has tried NSAIDS, heat, ice, and elevation with no relief.

## 2024-05-11 ENCOUNTER — Ambulatory Visit: Admitting: Podiatry

## 2024-05-15 ENCOUNTER — Ambulatory Visit: Admitting: Family Medicine

## 2024-05-19 ENCOUNTER — Encounter: Payer: Self-pay | Admitting: Family Medicine

## 2024-05-19 ENCOUNTER — Ambulatory Visit: Admitting: Family Medicine

## 2024-05-19 VITALS — BP 118/80 | HR 67 | Temp 98.2°F | Resp 18 | Ht 70.0 in | Wt 245.8 lb

## 2024-05-19 DIAGNOSIS — R0683 Snoring: Secondary | ICD-10-CM | POA: Insufficient documentation

## 2024-05-19 DIAGNOSIS — G8929 Other chronic pain: Secondary | ICD-10-CM | POA: Insufficient documentation

## 2024-05-19 DIAGNOSIS — M25571 Pain in right ankle and joints of right foot: Secondary | ICD-10-CM | POA: Diagnosis not present

## 2024-05-19 MED ORDER — ZEPBOUND 2.5 MG/0.5ML ~~LOC~~ SOAJ: 2.5000 mg | SUBCUTANEOUS | 0 refills | Status: DC

## 2024-05-19 NOTE — Assessment & Plan Note (Signed)
 Refer for sleep study

## 2024-05-19 NOTE — Assessment & Plan Note (Signed)
Per podiatry 

## 2024-05-19 NOTE — Progress Notes (Signed)
 Subjective:    Patient ID: Thomas King, male    DOB: [birth year], 43 y.o.   MRN: [id number]  Chief Complaint  Patient presents with   Handicap Placard     HPI Patient is in today for handicap app.  Discussed the use of AI scribe software for clinical note transcription with the patient, who gave verbal consent to proceed.  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  Tobacco Use   Smoking status: Never   Smokeless tobacco: Never  Vaping Use   Vaping status: Never Used  Substance and Sexual Activity   Alcohol use: Not Currently   Drug use: No   Sexual activity: Yes    Partners: Female    Birth control/protection: Condom  Other Topics Concern   Not on file  Social History Narrative   Exercise-- occassional   Social Drivers of Health   Financial Resource Strain: Not on file  Food Insecurity: Not on file  Transportation Needs: Not on file  Physical Activity: Not on file  Stress: Not on file  Social Connections: Not on file  Intimate Partner Violence: Not At Risk (10/25/2023)   Received from [facility]   HITS    Over the last 12 months how often did your partner physically hurt you?: Never    Over the last 12 months how often did your partner insult you or talk down to you?: Never    Over the last 12 months how often did your  partner threaten you with physical harm?: Never    Over the last 12 months how often did your partner scream or curse at you?: Never    Outpatient Medications Prior to Visit  Medication Sig Dispense Refill   amLODipine (NORVASC) 10 MG tablet Take 1 tablet (10 mg total) by mouth daily. 90 tablet 1   Ciclopirox 1 % shampoo Massage into scalp , wait 5 min then rinse, 2-3 x a week 120 mL 0   diclofenac (VOLTAREN) 75 MG EC tablet Take 1 tablet (75 mg total) by mouth 2 (two) times daily. (Patient not taking: Reported on 05/19/2024) 50 tablet 2   ketoconazole (NIZORAL) 2 % shampoo Apply 1 Application topically as directed. (Patient not taking: Reported on 05/19/2024)     meloxicam (MOBIC) 15 MG tablet Take 15 mg by mouth daily. (Patient not taking: Reported on 05/19/2024)     naproxen (NAPROSYN) 500 MG tablet Take 1 tablet (500 mg total) by mouth 2 (two) times daily as  needed (fever or body aches). (Patient not taking: Reported on 05/19/2024) 30 tablet 0   No facility-administered medications prior to visit.    Allergies  Allergen Reactions   Other Shortness Of Breath and Rash    ALL SEAFOOD   Shellfish Allergy Rash, Shortness Of Breath and Hives    Review of Systems  Constitutional:  Positive for malaise/fatigue. Negative for fever.  HENT:  Negative for congestion.   Eyes:  Negative for blurred vision.  Respiratory:  Negative for shortness of breath.   Cardiovascular:  Negative for chest pain, palpitations and leg swelling.  Gastrointestinal:  Negative for abdominal pain, blood in stool and nausea.  Genitourinary:  Negative for dysuria and frequency.  Musculoskeletal:  Positive for joint pain. Negative for falls.  Skin:  Negative for rash.  Neurological:  Negative for dizziness, loss of consciousness and headaches.  Endo/Heme/Allergies:  Negative for environmental allergies.  Psychiatric/Behavioral:  Negative for depression. The patient is not nervous/anxious.        Objective:     Physical Exam Vitals and nursing note reviewed.  Constitutional:      General: He is not in acute distress.    Appearance: Normal appearance. He is well-developed.  HENT:     Head: Normocephalic and atraumatic.  Eyes:     General: No scleral icterus.       Right eye: No discharge.        Left eye: No discharge.  Cardiovascular:     Rate and Rhythm: Normal rate and regular rhythm.     Heart sounds: No murmur heard. Pulmonary:     Effort: Pulmonary effort is normal. No respiratory distress.     Breath sounds: Normal breath sounds.  Musculoskeletal:        General: Tenderness present. Normal range of motion.     Cervical back: Normal range of motion and neck supple.     Right lower leg: No edema.     Left lower leg: No edema.  Skin:    General: Skin is warm and dry.  Neurological:     Mental Status: He is alert and oriented to person, place, and time.  Psychiatric:        Mood and Affect: Mood normal.        Behavior: Behavior normal.        Thought Content: Thought content normal.        Judgment: Judgment normal.     BP 118/80 (BP Location: Left Arm, Patient Position: Sitting, Cuff Size: Large)   Pulse 67   Temp 98.2 F (36.8 C) (Oral)   Resp 18   Ht 5' 10 (1.778 m)   Wt 245 lb 12.8 oz (111.5 kg)   SpO2 97%   BMI 35.27 kg/m  Wt Readings from Last 3 Encounters:  05/19/24 245 lb 12.8 oz (111.5 kg)  03/17/24 240 lb 6.4 oz (109 kg)  09/21/23 230 lb (104.3 kg)    Diabetic Foot Exam - Simple   No data filed    Lab Results  Component Value Date   WBC 6.3 03/17/2024   HGB 14.8 03/17/2024   HCT 46.4 03/17/2024   PLT 270 03/17/2024   GLUCOSE 91 03/17/2024   CHOL 164 03/17/2024   TRIG 234 (H) 03/17/2024   HDL 29 (L) 03/17/2024   LDLDIRECT 89.0 05/07/2022   LDLCALC 100 (H) 03/17/2024   ALT 26 03/17/2024   AST 23 03/17/2024   NA 139 03/17/2024   K 4.1 03/17/2024   CL  104 03/17/2024   CREATININE 1.27 03/17/2024   BUN 8 03/17/2024   CO2 26 03/17/2024   TSH  1.18 03/17/2024   PSA 0.52 03/17/2024    Lab Results  Component Value Date   TSH 1.18 03/17/2024   Lab Results  Component Value Date   WBC 6.3 03/17/2024   HGB 14.8 03/17/2024   HCT 46.4 03/17/2024   MCV 91.7 03/17/2024   PLT 270 03/17/2024   Lab Results  Component Value Date   NA 139 03/17/2024   K 4.1 03/17/2024   CO2 26 03/17/2024   GLUCOSE 91 03/17/2024   BUN 8 03/17/2024   CREATININE 1.27 03/17/2024   BILITOT 0.6 03/17/2024   ALKPHOS 101 05/07/2022   AST 23 03/17/2024   ALT 26 03/17/2024   PROT 7.5 03/17/2024   ALBUMIN 4.4 05/07/2022   CALCIUM 9.6 03/17/2024   EGFR 72 03/17/2024   GFR 73.05 05/07/2022   Lab Results  Component Value Date   CHOL 164 03/17/2024   Lab Results  Component Value Date   HDL 29 (L) 03/17/2024   Lab Results  Component Value Date   LDLCALC 100 (H) 03/17/2024   Lab Results  Component Value Date   TRIG 234 (H) 03/17/2024   Lab Results  Component Value Date   CHOLHDL 5.7 (H) 03/17/2024   No results found for: HGBA1C     Assessment & Plan:  Snoring Assessment & Plan: Refer for sleep study   Orders: -     Ambulatory referral to Neurology  Morbid obesity (HCC) -     Zepbound; Inject 2.5 mg into the skin once a week.  Dispense: 2 mL; Refill: 0  Chronic pain of right ankle Assessment & Plan: Per podiatry    Assessment and Plan Assessment & Plan       Jamee JONELLE Antonio Cyndee, DO

## 2024-06-13 ENCOUNTER — Ambulatory Visit (INDEPENDENT_AMBULATORY_CARE_PROVIDER_SITE_OTHER): Admitting: Neurology

## 2024-06-13 ENCOUNTER — Encounter: Payer: Self-pay | Admitting: Neurology

## 2024-06-13 VITALS — BP 129/85 | HR 68 | Ht 69.0 in | Wt 238.8 lb

## 2024-06-13 DIAGNOSIS — R519 Headache, unspecified: Secondary | ICD-10-CM

## 2024-06-13 DIAGNOSIS — G4719 Other hypersomnia: Secondary | ICD-10-CM | POA: Diagnosis not present

## 2024-06-13 DIAGNOSIS — R0683 Snoring: Secondary | ICD-10-CM

## 2024-06-13 DIAGNOSIS — E669 Obesity, unspecified: Secondary | ICD-10-CM

## 2024-06-13 DIAGNOSIS — R0681 Apnea, not elsewhere classified: Secondary | ICD-10-CM | POA: Diagnosis not present

## 2024-06-13 DIAGNOSIS — Z9189 Other specified personal risk factors, not elsewhere classified: Secondary | ICD-10-CM

## 2024-06-13 DIAGNOSIS — R351 Nocturia: Secondary | ICD-10-CM

## 2024-06-13 NOTE — Patient Instructions (Signed)

## 2024-06-13 NOTE — Progress Notes (Signed)
 Subjective:    Patient ID: Thomas King is a 43 y.o. male.  HPI    True Mar, MD, PhD [facility] [address] [address] [place], [place] [address]  Dear Dr. Antonio Meth,  I saw your patient, Thomas King, upon your kind request in my sleep clinic today for initial consultation of his sleep disorder, in particular, concern for underlying obstructive sleep apnea.  The patient is unaccompanied today but he had his fiance on his phone, not on speaker phone.  As you know, Thomas King is a 43 year old male with an underlying medical history of recurrent headaches, hypertension, chronic ankle pain, left knee arthroscopic surgeries, and obesity, who reports snoring and excessive daytime somnolence, as well as witnessed apneas per fianc's report. His Epworth sleepiness score is 10 out of 24, fatigue severity score is 13 out of 63.  I reviewed your office note from 05/19/2024.  He lives with his fiance and her 69 year old son son.  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  He works part-time.  He generally goes to bed between 10:30 PM and 11 PM and rise time is between 6:30 AM and 7 AM.  He has had occasional morning headaches.  He has nocturia about 3 times per average night.  He is not aware of any family history of sleep apnea.  He is a non-smoker.  He does not drink caffeine daily, no alcohol.  He has slowly gained weight over time, in the past 2 years about 20 pounds.  He is working on weight loss.  He has no pets in the household.  He does not watch TV in his bedroom.  His Past Medical History Is Significant For: Past Medical History:  Diagnosis Date   Frequent headaches    Hypertension     His Past Surgical History Is Significant For: Past Surgical History:  Procedure Laterality Date   ARTHROSCOPIC REPAIR ACL Left    x's 2     His Family History Is Significant For: Family History  Problem Relation Age of Onset   Hypertension Mother    Selective mutism Neg  Hx    Sleep apnea Neg Hx     His Social History Is Significant For: Social History   Socioeconomic History   Marital status: Single    Spouse name: Not on file   Number of children: Not on file   Years of education: Not on file   Highest education level: Not on file  Occupational History    Employer: [employer]    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  Tobacco Use   Smoking status: Never   Smokeless tobacco: Never  Vaping Use   Vaping status: Never Used  Substance and Sexual Activity   Alcohol use: Not Currently   Drug use: No   Sexual activity: Yes    Partners: Female    Birth control/protection: Condom  Other Topics Concern   Not on file  Social History Narrative   Exercise-- occassional   Pt lives with family    Pt works    Social Drivers of Corporate investment banker Strain: Not on Ship broker Insecurity: Not on file  Transportation Needs: Not on file  Physical Activity: Not on file  Stress: Not on file  Social Connections: Not on file    His Allergies Are:  Allergies  Allergen Reactions   Other Shortness Of Breath and Rash    ALL SEAFOOD   Shellfish Allergy Rash, Shortness  Of Breath and Hives  :   His Current Medications Are:  Outpatient Encounter Medications as of 06/13/2024  Medication Sig   amLODipine  (NORVASC ) 10 MG tablet Take 1 tablet (10 mg total) by mouth daily.   ketoconazole (NIZORAL) 2 % shampoo Apply 1 Application topically as directed.   Ciclopirox  1 % shampoo Massage into scalp , wait 5 min then rinse, 2-3 x a week (Patient not taking: Reported on 06/13/2024)   diclofenac  (VOLTAREN ) 75 MG EC tablet Take 1 tablet (75 mg total) by mouth 2 (two) times daily. (Patient not taking: Reported on 06/13/2024)   meloxicam (MOBIC) 15 MG tablet Take 15 mg by mouth daily. (Patient not taking: Reported on 05/19/2024)   naproxen  (NAPROSYN ) 500 MG tablet Take 1 tablet (500 mg total) by mouth 2 (two) times daily as needed (fever or body aches). (Patient not taking:  Reported on 05/19/2024)   tirzepatide  (ZEPBOUND ) 2.5 MG/0.5ML Pen Inject 2.5 mg into the skin once a week.   No facility-administered encounter medications on file as of 06/13/2024.  :   Review of Systems:  Out of a complete 14 point review of systems, all are reviewed and negative with the exception of these symptoms as listed below:   Review of Systems  Neurological:        Pt here for sleep consult Pt snores,fatigue,hypertension,headaches Pt denies sleep study,cpap machine    ESS:10 FSS:13    Objective:  Neurological Exam  Physical Exam Physical Examination:   Vitals:   06/13/24 0832  BP: 129/85  Pulse: 68    General Examination: The patient is a very pleasant 43 y.o. male in no acute distress. He appears well-developed and well-nourished and well groomed.   HEENT: Normocephalic, atraumatic, pupils are equal, round and reactive to light, extraocular tracking is good without limitation to gaze excursion or nystagmus noted. Hearing is grossly intact. Face is symmetric with normal facial animation. Speech is clear with no dysarthria noted. There is no hypophonia. There is no lip, neck/head, jaw or voice tremor. Neck is supple with full range of passive and active motion. There are no carotid bruits on auscultation. Oropharynx exam reveals: mild mouth dryness, good dental hygiene and moderate airway crowding, due to small airway entry, tonsillar size of about 1-2+, larger appearing uvula.  Mallampati class III, neck circumference 17-3/8 inches, minimal to mild overbite noted.  Tongue protrudes centrally and palate elevates symmetrically.  Nasal inspection reveals narrow nasal passage on the right, status post injury about 20 years ago with a cut, had to have stitches, unremarkable scar right lateral nose and nostril area.  Chest: Clear to auscultation without wheezing, rhonchi or crackles noted.  Heart: S1+S2+0, regular and normal without murmurs, rubs or gallops noted.   Abdomen:  Soft, non-tender and non-distended.  Extremities: There is no pitting edema in the distal lower extremities bilaterally.   Skin: Warm and dry without trophic changes noted.   Musculoskeletal: exam reveals no obvious joint deformities.   Neurologically:  Mental status: The patient is awake, alert and oriented in all 4 spheres. His immediate and remote memory, attention, language skills and fund of knowledge are appropriate. There is no evidence of aphasia, agnosia, apraxia or anomia. Speech is clear with normal prosody and enunciation. Thought process is linear. Mood is normal and affect is normal.  Cranial nerves II - XII are as described above under HEENT exam.  Motor exam: Normal bulk, strength and tone is noted. There is no obvious action or resting tremor.  Fine motor skills and coordination: grossly intact.  Cerebellar testing: No dysmetria or intention tremor. There is no truncal or gait ataxia.  Sensory exam: intact to light touch in the upper and lower extremities.  Gait, station and balance: He stands easily. No veering to one side is noted. No leaning to one side is noted. Posture is age-appropriate and stance is narrow based. Gait shows normal stride length and normal pace. No problems turning are noted.   Assessment and Plan:  In summary, Thomas King is a very pleasant 43 y.o.-year old male with an underlying medical history of recurrent headaches, hypertension, chronic ankle pain, left knee arthroscopic surgeries, and obesity, whose history and physical exam are concerning for sleep disordered breathing, particularly obstructive sleep apnea (OSA). A laboratory attended sleep study is typically considered gold standard for evaluation of sleep disordered breathing.   I had a long chat with the patient about my findings and the diagnosis of sleep apnea, particularly OSA, its prognosis and treatment options. We talked about medical/conservative treatments, surgical interventions and  non-pharmacological approaches for symptom control. I explained, in particular, the risks and ramifications of untreated moderate to severe OSA, especially with respect to developing cardiovascular disease down the road, including congestive heart failure (CHF), difficult to treat hypertension, cardiac arrhythmias (particularly A-fib), neurovascular complications including TIA, stroke and dementia. Even type 2 diabetes has, in part, been linked to untreated OSA. Symptoms of untreated OSA may include (but may not be limited to) daytime sleepiness, nocturia (i.e. frequent nighttime urination), memory problems, mood irritability and suboptimally controlled or worsening mood disorder such as depression and/or anxiety, lack of energy, lack of motivation, physical discomfort, as well as recurrent headaches, especially morning or nocturnal headaches. We talked about the importance of maintaining a healthy lifestyle and striving for healthy weight. In addition, we talked about the importance of striving for and maintaining good sleep hygiene. I recommended a sleep study at this time. I outlined the differences between a laboratory attended sleep study which is considered more comprehensive and accurate over the option of a home sleep test (HST); the latter may lead to underestimation of sleep disordered breathing in some instances and does not help with diagnosing upper airway resistance syndrome and is not accurate enough to diagnose primary central sleep apnea typically. I outlined possible surgical and non-surgical treatment options of OSA, including the use of a positive airway pressure (PAP) device (i.e. CPAP, AutoPAP/APAP or BiPAP in certain circumstances), a custom-made dental device (aka oral appliance, which would require a referral to a specialist dentist or orthodontist typically, and is generally speaking not considered for patients with full dentures or edentulous state), upper airway surgical options, such  as traditional UPPP (which is not considered a first-line treatment) or the Inspire device (hypoglossal nerve stimulator, which would involve a referral for consultation with an ENT surgeon, after careful selection, following inclusion criteria - also not first-line treatment). I explained the PAP treatment option to the patient in detail, as this is generally considered first-line treatment.  The patient indicated that he would be willing to try PAP therapy, if the need arises. I explained the importance of being compliant with PAP treatment, not only for insurance purposes but primarily to improve patient's symptoms symptoms, and for the patient's long term health benefit, including to reduce His cardiovascular risks longer-term.    We will pick up our discussion about the next steps and treatment options after testing.  We will keep him posted as to the  test results by phone call and/or MyChart messaging where possible.  We will plan to follow-up in sleep clinic accordingly as well.  I answered all his questions today and the patient was in agreement.   I encouraged him to call with any interim questions, concerns, problems or updates or email us  through MyChart.  Generally speaking, sleep test authorizations may take up to 2 weeks, sometimes less, sometimes longer, the patient is encouraged to get in touch with us  if they do not hear back from the sleep lab staff directly within the next 2 weeks.  Thank you very much for allowing me to participate in the care of this nice patient. If I can be of any further assistance to you please do not hesitate to call me at [phone].  Sincerely,   True Mar, MD, PhD

## 2024-06-18 ENCOUNTER — Ambulatory Visit
Admission: EM | Admit: 2024-06-18 | Discharge: 2024-06-18 | Disposition: A | Discharge status: Home / Self Care | Attending: Physician Assistant | Admitting: Physician Assistant

## 2024-06-18 ENCOUNTER — Other Ambulatory Visit: Payer: Self-pay

## 2024-06-18 ENCOUNTER — Observation Stay (HOSPITAL_COMMUNITY)
Admission: EM | Admit: 2024-06-18 | Discharge: 2024-06-20 | Disposition: A | Discharge status: Home / Self Care | Attending: Internal Medicine | Admitting: Internal Medicine

## 2024-06-18 DIAGNOSIS — E1165 Type 2 diabetes mellitus with hyperglycemia: Secondary | ICD-10-CM | POA: Insufficient documentation

## 2024-06-18 DIAGNOSIS — E876 Hypokalemia: Secondary | ICD-10-CM | POA: Insufficient documentation

## 2024-06-18 DIAGNOSIS — E66811 Obesity, class 1: Secondary | ICD-10-CM | POA: Insufficient documentation

## 2024-06-18 DIAGNOSIS — E119 Type 2 diabetes mellitus without complications: Secondary | ICD-10-CM | POA: Diagnosis not present

## 2024-06-18 DIAGNOSIS — Z79899 Other long term (current) drug therapy: Secondary | ICD-10-CM | POA: Insufficient documentation

## 2024-06-18 DIAGNOSIS — Z6834 Body mass index (BMI) 34.0-34.9, adult: Secondary | ICD-10-CM | POA: Diagnosis not present

## 2024-06-18 DIAGNOSIS — Z794 Long term (current) use of insulin: Secondary | ICD-10-CM | POA: Diagnosis not present

## 2024-06-18 DIAGNOSIS — N179 Acute kidney failure, unspecified: Secondary | ICD-10-CM | POA: Insufficient documentation

## 2024-06-18 DIAGNOSIS — Z7901 Long term (current) use of anticoagulants: Secondary | ICD-10-CM | POA: Diagnosis not present

## 2024-06-18 DIAGNOSIS — E1110 Type 2 diabetes mellitus with ketoacidosis without coma: Secondary | ICD-10-CM | POA: Diagnosis not present

## 2024-06-18 DIAGNOSIS — R350 Frequency of micturition: Secondary | ICD-10-CM | POA: Diagnosis present

## 2024-06-18 DIAGNOSIS — R739 Hyperglycemia, unspecified: Secondary | ICD-10-CM

## 2024-06-18 DIAGNOSIS — E1010 Type 1 diabetes mellitus with ketoacidosis without coma: Secondary | ICD-10-CM

## 2024-06-18 DIAGNOSIS — I10 Essential (primary) hypertension: Secondary | ICD-10-CM | POA: Insufficient documentation

## 2024-06-18 LAB — POCT URINE DIPSTICK
Bilirubin, UA: NEGATIVE
Blood, UA: NEGATIVE
Glucose, UA: 500 mg/dL — AB
Ketones, POC UA: NEGATIVE mg/dL
Leukocytes, UA: NEGATIVE
Nitrite, UA: NEGATIVE
POC PROTEIN,UA: NEGATIVE
Spec Grav, UA: <=1.005 — AB (ref 1.010–1.025)
Urobilinogen, UA: 0.2 U/dL
pH, UA: 5.5 (ref 5.0–8.0)

## 2024-06-18 LAB — CBC
HCT: 50.2 % (ref 39.0–52.0)
Hemoglobin: 16.7 g/dL (ref 13.0–17.0)
MCH: 28.2 pg (ref 26.0–34.0)
MCHC: 33.3 g/dL (ref 30.0–36.0)
MCV: 84.8 fL (ref 80.0–100.0)
Platelets: 314 K/uL (ref 150–400)
RBC: 5.92 MIL/uL — ABNORMAL HIGH (ref 4.22–5.81)
RDW: 11.9 % (ref 11.5–15.5)
WBC: 9.6 K/uL (ref 4.0–10.5)
nRBC: 0 % (ref 0.0–0.2)

## 2024-06-18 LAB — URINALYSIS, ROUTINE W REFLEX MICROSCOPIC
Bacteria, UA: NONE SEEN
Bilirubin Urine: NEGATIVE
Glucose, UA: >=500 mg/dL — AB
Hgb urine dipstick: NEGATIVE
Ketones, ur: 5 mg/dL — AB
Leukocytes,Ua: NEGATIVE
Nitrite: NEGATIVE
Protein, ur: NEGATIVE mg/dL
Specific Gravity, Urine: 1.026 (ref 1.005–1.030)
pH: 5 (ref 5.0–8.0)

## 2024-06-18 LAB — COMPREHENSIVE METABOLIC PANEL WITH GFR
ALT: 37 U/L (ref 0–44)
AST: 30 U/L (ref 15–41)
Albumin: 4.7 g/dL (ref 3.5–5.0)
Alkaline Phosphatase: 192 U/L — ABNORMAL HIGH (ref 38–126)
Anion gap: 21 — ABNORMAL HIGH (ref 5–15)
BUN: 18 mg/dL (ref 6–20)
CO2: 20 mmol/L — ABNORMAL LOW (ref 22–32)
Calcium: 10.2 mg/dL (ref 8.9–10.3)
Chloride: 86 mmol/L — ABNORMAL LOW (ref 98–111)
Creatinine, Ser: 1.56 mg/dL — ABNORMAL HIGH (ref 0.61–1.24)
GFR, Estimated: 56 mL/min — ABNORMAL LOW (ref >60)
Glucose, Bld: 855 mg/dL (ref 70–99)
Potassium: 4.6 mmol/L (ref 3.5–5.1)
Sodium: 127 mmol/L — ABNORMAL LOW (ref 135–145)
Total Bilirubin: 0.6 mg/dL (ref 0.0–1.2)
Total Protein: 8.7 g/dL — ABNORMAL HIGH (ref 6.5–8.1)

## 2024-06-18 LAB — CBG MONITORING, ED
Glucose-Capillary: >600 mg/dL (ref 70–99)
Glucose-Capillary: >600 mg/dL (ref 70–99)
Glucose-Capillary: 555 mg/dL (ref 70–99)
Glucose-Capillary: 388 mg/dL — ABNORMAL HIGH (ref 70–99)
Glucose-Capillary: 429 mg/dL — ABNORMAL HIGH (ref 70–99)

## 2024-06-18 LAB — GLUCOSE, CAPILLARY
Glucose-Capillary: 176 mg/dL — ABNORMAL HIGH (ref 70–99)
Glucose-Capillary: 177 mg/dL — ABNORMAL HIGH (ref 70–99)
Glucose-Capillary: 178 mg/dL — ABNORMAL HIGH (ref 70–99)
Glucose-Capillary: 179 mg/dL — ABNORMAL HIGH (ref 70–99)
Glucose-Capillary: 256 mg/dL — ABNORMAL HIGH (ref 70–99)
Glucose-Capillary: 274 mg/dL — ABNORMAL HIGH (ref 70–99)
Glucose-Capillary: 278 mg/dL — ABNORMAL HIGH (ref 70–99)
Glucose-Capillary: 284 mg/dL — ABNORMAL HIGH (ref 70–99)
Glucose-Capillary: 286 mg/dL — ABNORMAL HIGH (ref 70–99)

## 2024-06-18 LAB — BASIC METABOLIC PANEL WITH GFR
Anion gap: 14 (ref 5–15)
Anion gap: 13 (ref 5–15)
BUN: 13 mg/dL (ref 6–20)
BUN: 12 mg/dL (ref 6–20)
CO2: 24 mmol/L (ref 22–32)
CO2: 24 mmol/L (ref 22–32)
Calcium: 9.8 mg/dL (ref 8.9–10.3)
Calcium: 9.7 mg/dL (ref 8.9–10.3)
Chloride: 100 mmol/L (ref 98–111)
Chloride: 101 mmol/L (ref 98–111)
Creatinine, Ser: 1.26 mg/dL — ABNORMAL HIGH (ref 0.61–1.24)
Creatinine, Ser: 1.18 mg/dL (ref 0.61–1.24)
GFR, Estimated: >60 mL/min (ref >60)
GFR, Estimated: >60 mL/min (ref >60)
Glucose, Bld: 314 mg/dL — ABNORMAL HIGH (ref 70–99)
Glucose, Bld: 258 mg/dL — ABNORMAL HIGH (ref 70–99)
Potassium: 3.8 mmol/L (ref 3.5–5.1)
Potassium: 3.8 mmol/L (ref 3.5–5.1)
Sodium: 137 mmol/L (ref 135–145)
Sodium: 138 mmol/L (ref 135–145)

## 2024-06-18 LAB — BLOOD GAS, VENOUS
Acid-base deficit: 0.3 mmol/L (ref 0.0–2.0)
Bicarbonate: 27.1 mmol/L (ref 20.0–28.0)
O2 Saturation: 67.6 %
Patient temperature: 37
pCO2, Ven: 55 mmHg (ref 44–60)
pH, Ven: 7.3 (ref 7.25–7.43)
pO2, Ven: 38 mmHg (ref 32–45)

## 2024-06-18 LAB — GLUCOSE, POCT (MANUAL RESULT ENTRY): POCT Glucose (KUC): 601 mg/dL — AB (ref 70–99)

## 2024-06-18 LAB — BETA-HYDROXYBUTYRIC ACID: Beta-Hydroxybutyric Acid: 1.7 mmol/L — ABNORMAL HIGH (ref 0.05–0.27)

## 2024-06-18 MED ORDER — AMLODIPINE BESYLATE 10 MG PO TABS
10.0000 mg | ORAL_TABLET | Freq: Every day | ORAL | Status: DC
  Administered 2024-06-19 – 2024-06-20 (×2): 10 mg via ORAL
  Filled 2024-06-18 (×2): qty 1

## 2024-06-18 MED ORDER — HEPARIN SODIUM (PORCINE) 5000 UNIT/ML IJ SOLN
5000.0000 [IU] | Freq: Three times a day (TID) | INTRAMUSCULAR | Status: DC
  Administered 2024-06-18: 5000 [IU] via SUBCUTANEOUS
  Filled 2024-06-18 (×3): qty 1

## 2024-06-18 MED ORDER — DEXTROSE 50 % IV SOLN
0.0000 mL | INTRAVENOUS | Status: DC | PRN
  Filled 2024-06-18: qty 50

## 2024-06-18 MED ORDER — DEXTROSE IN LACTATED RINGERS 5 % IV SOLN
INTRAVENOUS | Status: AC
Start: 2024-06-18 — End: 2024-06-19

## 2024-06-18 MED ORDER — INSULIN REGULAR(HUMAN) IN NACL 100-0.9 UT/100ML-% IV SOLN
INTRAVENOUS | Status: AC
  Administered 2024-06-18: 15 [IU]/h via INTRAVENOUS
  Filled 2024-06-18: qty 100

## 2024-06-18 MED ORDER — ONDANSETRON HCL 4 MG PO TABS: 4.0000 mg | ORAL_TABLET | Freq: Four times a day (QID) | ORAL | Status: DC | PRN

## 2024-06-18 MED ORDER — ACETAMINOPHEN 650 MG RE SUPP: 650.0000 mg | Freq: Four times a day (QID) | RECTAL | Status: DC | PRN

## 2024-06-18 MED ORDER — LACTATED RINGERS IV BOLUS
20.0000 mL/kg | Freq: Once | INTRAVENOUS | Status: AC
  Administered 2024-06-18: 2132 mL via INTRAVENOUS

## 2024-06-18 MED ORDER — POTASSIUM CHLORIDE 10 MEQ/100ML IV SOLN
10.0000 meq | INTRAVENOUS | Status: AC
  Administered 2024-06-18 (×2): 10 meq via INTRAVENOUS
  Filled 2024-06-18 (×2): qty 100

## 2024-06-18 MED ORDER — LACTATED RINGERS IV SOLN
INTRAVENOUS | Status: DC
Start: 2024-06-18 — End: 2024-06-19

## 2024-06-18 MED ORDER — ONDANSETRON HCL 4 MG/2ML IJ SOLN: 4.0000 mg | Freq: Four times a day (QID) | INTRAMUSCULAR | Status: DC | PRN

## 2024-06-18 MED ORDER — ACETAMINOPHEN 325 MG PO TABS
650.0000 mg | ORAL_TABLET | Freq: Four times a day (QID) | ORAL | Status: DC | PRN
  Administered 2024-06-19 (×2): 650 mg via ORAL
  Filled 2024-06-18 (×2): qty 2

## 2024-06-18 MED ORDER — SODIUM CHLORIDE 0.9 % IV BOLUS
1000.0000 mL | Freq: Once | INTRAVENOUS | Status: AC
  Administered 2024-06-18: 1000 mL via INTRAVENOUS

## 2024-06-18 MED ORDER — ALBUTEROL SULFATE (2.5 MG/3ML) 0.083% IN NEBU: 2.5000 mg | INHALATION_SOLUTION | RESPIRATORY_TRACT | Status: DC | PRN

## 2024-06-18 NOTE — H&P (Signed)
 History and Physical  Thomas King FMW:[id number] DOB: December 09, 1980 DOA: 06/18/2024  PCP: Antonio Cyndee Jamee JONELLE, DO   Chief Complaint: Frequent urination  HPI: Thomas King is a 43 y.o. male with medical history significant for hypertension and family history of diabetes presents to the hospital complaining of 4 days of fatigue, polydipsia, polyuria, and blurry vision found to have hyperglycemia and diabetic ketoacidosis.  He takes amlodipine  daily for hypertension, has been taking naproxen  intermittently for elbow and ankle discomfort.  Intermittently, he is also taking courses of p.o. prednisone  though the last time was probably at least a month ago.  Over the last 4 days, he has noticed increasing fatigue, polydipsia, polyuria, and noticed blurry vision.  He denies any fevers, chills, nausea, vomiting, diarrhea, dysuria, or any other signs or symptoms of acute infection.  He presented to urgent care today and was told to go to the ER for evaluation of severe hyperglycemia.  Workup in the ER as noted below shows evidence of hyperglycemia, acute kidney injury and associated DKA.  He was started on IV fluids, IV insulin  drip, and hospitalist admission was requested.  Review of Systems: Please see HPI for pertinent positives and negatives. A complete 10 system review of systems are otherwise negative.  Past Medical History:  Diagnosis Date   Frequent headaches    Hypertension    Past Surgical History:  Procedure Laterality Date   ARTHROSCOPIC REPAIR ACL Left    x's 2    Social History:  reports that he has never smoked. He has never used smokeless tobacco. He reports that he does not currently use alcohol. He reports that he does not use drugs.  Allergies  Allergen Reactions   Other Shortness Of Breath and Rash    ALL SEAFOOD   Shellfish Allergy Rash, Shortness Of Breath and Hives    Family History  Problem Relation Age of Onset   Hypertension Mother    Selective mutism Neg Hx     Sleep apnea Neg Hx      Prior to Admission medications   Medication Sig Start Date End Date Taking? Authorizing Provider  amLODipine  (NORVASC ) 10 MG tablet Take 1 tablet (10 mg total) by mouth daily. 03/17/24  Yes Antonio Cyndee Jamee R, DO  Ciclopirox  1 % shampoo Massage into scalp , wait 5 min then rinse, 2-3 x a week 03/17/24  Yes Lowne Cyndee Jamee JONELLE, DO  naproxen  (NAPROSYN ) 500 MG tablet Take 1 tablet (500 mg total) by mouth 2 (two) times daily as needed (fever or body aches). 10/12/23  Yes Arloa Suzen RAMAN, NP  tirzepatide  (ZEPBOUND ) 2.5 MG/0.5ML Pen Inject 2.5 mg into the skin once a week. 05/19/24  Yes Antonio Cyndee Jamee JONELLE, DO    Physical Exam: BP (!) 152/108 (BP Location: Left Arm)   Pulse 78   Temp 98.2 F (36.8 C) (Oral)   Resp 18   Ht 5' 9 (1.753 m)   Wt 106.6 kg   SpO2 94%   BMI 34.70 kg/m  General:  Alert, oriented, calm, in no acute distress  Eyes: EOMI, clear conjuctivae, white sclerea Neck: supple, no masses, trachea mildline  Cardiovascular: RRR, no murmurs or rubs, no peripheral edema  Respiratory: clear to auscultation bilaterally, no wheezes, no crackles  Abdomen: soft, nontender, nondistended, normal bowel tones heard  Skin: dry, no rashes  Musculoskeletal: no joint effusions, normal range of motion  Psychiatric: appropriate affect, normal speech  Neurologic: extraocular muscles intact, clear speech, moving all extremities  with intact sensorium         Labs on Admission:  Basic Metabolic Panel: Recent Labs  Lab 06/18/24 1108  NA 127*  K 4.6  CL 86*  CO2 20*  GLUCOSE 855*  BUN 18  CREATININE 1.56*  CALCIUM 10.2   Liver Function Tests: Recent Labs  Lab 06/18/24 1108  AST 30  ALT 37  ALKPHOS 192*  BILITOT 0.6  PROT 8.7*  ALBUMIN 4.7   No results for input(s): LIPASE, AMYLASE in the last 168 hours. No results for input(s): AMMONIA in the last 168 hours. CBC: Recent Labs  Lab 06/18/24 1108  WBC 9.6  HGB 16.7  HCT 50.2  MCV 84.8   PLT 314   Cardiac Enzymes: No results for input(s): CKTOTAL, CKMB, CKMBINDEX, TROPONINI in the last 168 hours. BNP (last 3 results) No results for input(s): BNP in the last 8760 hours.  ProBNP (last 3 results) No results for input(s): PROBNP in the last 8760 hours.  CBG: Recent Labs  Lab 06/18/24 1044 06/18/24 1052 06/18/24 1359 06/18/24 1443  GLUCAP >600* >600* 555* 429*    Radiological Exams on Admission: No results found. Assessment/Plan Thomas King is a 43 y.o. male with medical history significant for hypertension and family history of diabetes presents to the hospital complaining of 4 days of fatigue, polydipsia, polyuria, and blurry vision found to have hyperglycemia and diabetic ketoacidosis.  DKA-likely due to new onset diabetes, note that his blood sugar was normal during PCP visit June 2025.  In the interim, he has taken a few courses of p.o. prednisone  which may have also exacerbated his hyperglycemia. -Inpatient admission -Clear liquid diet -IV insulin  drip, IV fluids and frequent BMP per DKA protocol -Will plan to transition to carb modified diet, sliding scale insulin  once anion gap is closed x 2 -Check hemoglobin A1c -Diabetic educator consult  Hypertension-amlodipine  10 mg p.o. daily  Acute kidney injury-due to dehydration related to diabetic ketoacidosis -IV fluids as above -Avoid nephrotoxins -Monitor renal function with daily labs  DVT prophylaxis: Subcutaneous heparin     Code Status: Full Code  Consults called: None  Admission status: The appropriate patient status for this patient is INPATIENT. Inpatient status is judged to be reasonable and necessary in order to provide the required intensity of service to ensure the patient's safety. The patient's presenting symptoms, physical exam findings, and initial radiographic and laboratory data in the context of their chronic comorbidities is felt to place them at high risk for further  clinical deterioration. Furthermore, it is not anticipated that the patient will be medically stable for discharge from the hospital within 2 midnights of admission.    I certify that at the point of admission it is my clinical judgment that the patient will require inpatient hospital care spanning beyond 2 midnights from the point of admission due to high intensity of service, high risk for further deterioration and high frequency of surveillance required  Time spent: 59 minutes  Kalyan Barabas CHRISTELLA Gail MD [facility] Pager [phone]  If 7PM-7AM, please contact night-coverage [email or web address] Password [facility]  06/18/2024, 2:45 PM

## 2024-06-18 NOTE — ED Notes (Signed)
 Patient is being discharged from the Urgent Care and sent to the Emergency Department via POV . Per RM, patient is in need of higher level of care due to new onset DM/hyperglycemic. Patient is aware and verbalizes understanding of plan of care.  Vitals:   06/18/24 0916  BP: (!) 138/91  Pulse: 84  Resp: 20  Temp: (!) 97.5 F (36.4 C)  SpO2: 95%

## 2024-06-18 NOTE — ED Triage Notes (Signed)
 Patient to ED by POV with c/o high glucose. Patient CBG would not read in triage registered HIGH. He reports urine frequency and blurred vision at this time.

## 2024-06-18 NOTE — ED Provider Notes (Signed)
 [facility]    CSN: [id number] Arrival date & time: 06/18/24  0848      History   Chief Complaint Chief Complaint  Patient presents with   Vision Problems   Urinary Frequency    HPI Thomas King is a 43 y.o. male.   Patient here today with wife for evaluation of polyuria, polydipsia that started several days ago.  He reports he is also having some blurry vision especially at night when he drives.  He denies any history of diabetes.  He denies any nausea or vomiting.  The history is provided by the patient.  Urinary Frequency Pertinent negatives include no shortness of breath.    Past Medical History:  Diagnosis Date   Frequent headaches    Hypertension     Patient Active Problem List   Diagnosis Date Noted   Morbid obesity (HCC) 05/19/2024   Snoring 05/19/2024   Chronic pain of right ankle 05/19/2024   Penile discharge 06/30/2022   Seborrheic dermatitis of scalp 06/30/2022   Family history of brain aneurysm 05/28/2022   Preventative health care 07/28/2018   Essential hypertension 10/01/2014   Hyperlipidemia 10/01/2014   Ulna fracture 09/14/2014   Left knee pain 10/11/2013   Obesity (BMI 30-39.9) 07/29/2013   Class 1 obesity due to excess calories with serious comorbidity and body mass index (BMI) of 34.0 to 34.9 in adult 07/29/2013    Past Surgical History:  Procedure Laterality Date   ARTHROSCOPIC REPAIR ACL Left    x's 2        Home Medications    Prior to Admission medications   Medication Sig Start Date End Date Taking? Authorizing Provider  amLODipine  (NORVASC ) 10 MG tablet Take 1 tablet (10 mg total) by mouth daily. 03/17/24  Yes Antonio Cyndee Jamee JONELLE, DO  Ciclopirox  1 % shampoo Massage into scalp , wait 5 min then rinse, 2-3 x a week Patient not taking: No sig reported 03/17/24   Antonio Cyndee, Jamee JONELLE, DO  diclofenac  (VOLTAREN ) 75 MG EC tablet Take 1 tablet (75 mg total) by mouth 2 (two) times daily. Patient not taking: Reported on  04/03/2024 10/29/23   Magdalen Pasco RAMAN, DPM  ketoconazole (NIZORAL) 2 % shampoo Apply 1 Application topically as directed. 06/30/22   [provider]  meloxicam (MOBIC) 15 MG tablet Take 15 mg by mouth daily. Patient not taking: Reported on 05/03/2024 03/24/24   [provider]  naproxen  (NAPROSYN ) 500 MG tablet Take 1 tablet (500 mg total) by mouth 2 (two) times daily as needed (fever or body aches). Patient not taking: Reported on 04/03/2024 10/12/23   Arloa Suzen RAMAN, NP  tirzepatide  (ZEPBOUND ) 2.5 MG/0.5ML Pen Inject 2.5 mg into the skin once a week. 05/19/24   Antonio Cyndee Jamee JONELLE, DO    Family History Family History  Problem Relation Age of Onset   Hypertension Mother    Selective mutism Neg Hx    Sleep apnea Neg Hx     Social History Social History   Tobacco Use   Smoking status: Never   Smokeless tobacco: Never  Vaping Use   Vaping status: Never Used  Substance Use Topics   Alcohol use: Not Currently   Drug use: No     Allergies   Other and Shellfish allergy   Review of Systems Review of Systems  Eyes:  Positive for visual disturbance.  Respiratory:  Negative for shortness of breath.   Gastrointestinal:  Negative for nausea and vomiting.  Endocrine:  Positive for polydipsia and polyuria.  Genitourinary:  Positive for frequency.  Neurological:  Negative for dizziness.     Physical Exam Triage Vital Signs ED Triage Vitals  Encounter Vitals Group     BP 06/18/24 0916 (!) 138/91     Girls Systolic BP Percentile --      Girls Diastolic BP Percentile --      Boys Systolic BP Percentile --      Boys Diastolic BP Percentile --      Pulse Rate 06/18/24 0916 84     Resp 06/18/24 0916 20     Temp 06/18/24 0916 (!) 97.5 F (36.4 C)     Temp Source 06/18/24 0916 Oral     SpO2 06/18/24 0916 95 %     Weight 06/18/24 0913 233 lb (105.7 kg)     Height 06/18/24 0913 5' 9 (1.753 m)     Head Circumference --      Peak Flow --      Pain Score 06/18/24  0913 0     Pain Loc --      Pain Education --      Exclude from Growth Chart --    No data found.  Updated Vital Signs BP (!) 138/91 (BP Location: Right Arm)   Pulse 84   Temp (!) 97.5 F (36.4 C) (Oral)   Resp 20   Ht 5' 9 (1.753 m)   Wt 233 lb (105.7 kg)   SpO2 95%   BMI 34.41 kg/m   Visual Acuity Right Eye Distance: (S) 20/50 (Uncorrected) Left Eye Distance: (S) 20/50 (Uncorrected) Bilateral Distance: (S) 20/50 (Uncorrected)  Right Eye Near:   Left Eye Near:    Bilateral Near:     Physical Exam Vitals and nursing note reviewed.  Constitutional:      General: He is not in acute distress.    Appearance: Normal appearance. He is not ill-appearing.  HENT:     Head: Normocephalic.  Eyes:     Conjunctiva/sclera: Conjunctivae normal.  Cardiovascular:     Rate and Rhythm: Normal rate.  Pulmonary:     Effort: Pulmonary effort is normal. No respiratory distress.  Neurological:     Mental Status: He is alert.  Psychiatric:        Mood and Affect: Mood normal.        Behavior: Behavior normal.      UC Treatments / Results  Labs (all labs ordered are listed, but only abnormal results are displayed) Labs Reviewed  POCT URINE DIPSTICK - Abnormal; Notable for the following components:      Result Value   Color, UA light yellow (*)    Clarity, UA cloudy (*)    Glucose, UA =500 (*)    Spec Grav, UA <=1.005 (*)    All other components within normal limits  GLUCOSE, POCT (MANUAL RESULT ENTRY) - Abnormal; Notable for the following components:   POCT Glucose ([facility]) 601 (*)    All other components within normal limits    EKG   Radiology No results found.  Procedures Procedures (including critical care time)  Medications Ordered in UC Medications - No data to display  Initial Impression / Assessment and Plan / UC Course  I have reviewed the triage vital signs and the nursing notes.  Pertinent labs & imaging results that were available during my care of the  patient were reviewed by me and considered in my medical decision making (see chart for details).    Glucose  in office greater than 600.  Recommended further evaluation in the emergency room given new onset diabetes.  Wife who is with him will transport via POV.  Final Clinical Impressions(s) / UC Diagnoses   Final diagnoses:  New onset type 2 diabetes mellitus (HCC)   Discharge Instructions   None    ED Prescriptions   None    PDMP not reviewed this encounter.   Billy Asberry FALCON, PA-C 06/18/24 [phone]

## 2024-06-18 NOTE — Progress Notes (Signed)
 Patient is mobile and stated he received the Heprain shot subcutaneously shortly before this RN's shift began and therefore does not want the 2200 dose. The patient is ambulatory and can move his extremities. Lynwood Kipper, NP notified also.

## 2024-06-18 NOTE — ED Provider Notes (Signed)
 [place] [facility] AT [facility] Provider Note  CSN: [id number] Arrival date & time: 06/18/24 1030  Chief Complaint(s) Hyperglycemia  HPI Thomas King is a 43 y.o. male history of obesity, hypertension, hyponatremia presenting to the emergency department with abnormal glucose.  Patient reports over the last few days he has been having polyuria, polydipsia, fatigue, blurry vision.  No chest pain, shortness of breath.  No abdominal pain.  No nausea or vomiting.  No painful urination.  Denies any history of diabetes.  Went to urgent care and was found to have high blood sugar so referred to the ER.  Denies any history of this.   Past Medical History Past Medical History:  Diagnosis Date   Frequent headaches    Hypertension    Patient Active Problem List   Diagnosis Date Noted   DKA (diabetic ketoacidosis) (HCC) 06/18/2024   Morbid obesity (HCC) 05/19/2024   Snoring 05/19/2024   Chronic pain of right ankle 05/19/2024   Penile discharge 06/30/2022   Seborrheic dermatitis of scalp 06/30/2022   Family history of brain aneurysm 05/28/2022   Preventative health care 07/28/2018   Essential hypertension 10/01/2014   Hyperlipidemia 10/01/2014   Ulna fracture 09/14/2014   Left knee pain 10/11/2013   Obesity (BMI 30-39.9) 07/29/2013   Class 1 obesity due to excess calories with serious comorbidity and body mass index (BMI) of 34.0 to 34.9 in adult 07/29/2013   Home Medication(s) Prior to Admission medications   Medication Sig Start Date End Date Taking? Authorizing Provider  amLODipine  (NORVASC ) 10 MG tablet Take 1 tablet (10 mg total) by mouth daily. 03/17/24   Antonio Cyndee Jamee JONELLE, DO  Ciclopirox  1 % shampoo Massage into scalp , wait 5 min then rinse, 2-3 x a week Patient not taking: No sig reported 03/17/24   Antonio Cyndee Jamee JONELLE, DO  diclofenac  (VOLTAREN ) 75 MG EC tablet Take 1 tablet (75 mg total) by mouth 2 (two) times daily. Patient not taking: Reported on  04/03/2024 10/29/23   Magdalen Pasco RAMAN, DPM  ketoconazole (NIZORAL) 2 % shampoo Apply 1 Application topically as directed. 06/30/22   [provider]  meloxicam (MOBIC) 15 MG tablet Take 15 mg by mouth daily. Patient not taking: Reported on 05/03/2024 03/24/24   [provider]  naproxen  (NAPROSYN ) 500 MG tablet Take 1 tablet (500 mg total) by mouth 2 (two) times daily as needed (fever or body aches). Patient not taking: Reported on 04/03/2024 10/12/23   Arloa Suzen RAMAN, NP  tirzepatide  (ZEPBOUND ) 2.5 MG/0.5ML Pen Inject 2.5 mg into the skin once a week. 05/19/24   Antonio Cyndee Jamee JONELLE, DO                                                                                                                                    Past Surgical History Past Surgical History:  Procedure Laterality Date   ARTHROSCOPIC REPAIR ACL  Left    x's 2    Family History Family History  Problem Relation Age of Onset   Hypertension Mother    Selective mutism Neg Hx    Sleep apnea Neg Hx     Social History Social History   Tobacco Use   Smoking status: Never   Smokeless tobacco: Never  Vaping Use   Vaping status: Never Used  Substance Use Topics   Alcohol use: Not Currently   Drug use: No   Allergies Other and Shellfish allergy  Review of Systems Review of Systems  All other systems reviewed and are negative.   Physical Exam Vital Signs  I have reviewed the triage vital signs BP (!) 152/108 (BP Location: Left Arm)   Pulse 78   Temp 98.2 F (36.8 C) (Oral)   Resp 18   Ht 5' 9 (1.753 m)   Wt 106.6 kg   SpO2 94%   BMI 34.70 kg/m  Physical Exam Vitals and nursing note reviewed.  Constitutional:      General: He is not in acute distress.    Appearance: Normal appearance. He is obese.  HENT:     Mouth/Throat:     Mouth: Mucous membranes are dry.  Eyes:     Conjunctiva/sclera: Conjunctivae normal.  Cardiovascular:     Rate and Rhythm: Normal rate and regular rhythm.   Pulmonary:     Effort: Pulmonary effort is normal. No respiratory distress.     Breath sounds: Normal breath sounds.  Abdominal:     General: Abdomen is flat.     Palpations: Abdomen is soft.     Tenderness: There is no abdominal tenderness.  Musculoskeletal:     Right lower leg: No edema.     Left lower leg: No edema.  Skin:    General: Skin is warm and dry.     Capillary Refill: Capillary refill takes less than 2 seconds.  Neurological:     Mental Status: He is alert and oriented to person, place, and time. Mental status is at baseline.  Psychiatric:        Mood and Affect: Mood normal.        Behavior: Behavior normal.     ED Results and Treatments Labs (all labs ordered are listed, but only abnormal results are displayed) Labs Reviewed  CBC - Abnormal; Notable for the following components:      Result Value   RBC 5.92 (*)    All other components within normal limits  URINALYSIS, ROUTINE W REFLEX MICROSCOPIC - Abnormal; Notable for the following components:   Color, Urine COLORLESS (*)    Glucose, UA >=500 (*)    Ketones, ur 5 (*)    All other components within normal limits  COMPREHENSIVE METABOLIC PANEL WITH GFR - Abnormal; Notable for the following components:   Sodium 127 (*)    Chloride 86 (*)    CO2 20 (*)    Glucose, Bld 855 (*)    Creatinine, Ser 1.56 (*)    Total Protein 8.7 (*)    Alkaline Phosphatase 192 (*)    GFR, Estimated 56 (*)    Anion gap 21 (*)    All other components within normal limits  BETA-HYDROXYBUTYRIC ACID - Abnormal; Notable for the following components:   Beta-Hydroxybutyric Acid 1.70 (*)    All other components within normal limits  CBG MONITORING, ED - Abnormal; Notable for the following components:   Glucose-Capillary >600 (*)    All other components within normal  limits  CBG MONITORING, ED - Abnormal; Notable for the following components:   Glucose-Capillary >600 (*)    All other components within normal limits  CBG  MONITORING, ED - Abnormal; Notable for the following components:   Glucose-Capillary 555 (*)    All other components within normal limits  BLOOD GAS, VENOUS  BASIC METABOLIC PANEL WITH GFR  BASIC METABOLIC PANEL WITH GFR  BASIC METABOLIC PANEL WITH GFR  CBG MONITORING, ED                                                                                                                          Radiology No results found.  Pertinent labs & imaging results that were available during my care of the patient were reviewed by me and considered in my medical decision making (see MDM for details).  Medications Ordered in ED Medications  insulin  regular, human (MYXREDLIN ) 100 units/ 100 mL infusion (15 Units/hr Intravenous New Bag/Given 06/18/24 1407)  lactated ringers  infusion (has no administration in time range)  dextrose  5 % in lactated ringers  infusion (has no administration in time range)  dextrose  50 % solution 0-50 mL (has no administration in time range)  potassium chloride  10 mEq in 100 mL IVPB (10 mEq Intravenous New Bag/Given 06/18/24 1409)  amLODipine  (NORVASC ) tablet 10 mg (has no administration in time range)  acetaminophen  (TYLENOL ) tablet 650 mg (has no administration in time range)    Or  acetaminophen  (TYLENOL ) suppository 650 mg (has no administration in time range)  ondansetron  (ZOFRAN ) tablet 4 mg (has no administration in time range)    Or  ondansetron  (ZOFRAN ) injection 4 mg (has no administration in time range)  albuterol  (PROVENTIL ) (2.5 MG/3ML) 0.083% nebulizer solution 2.5 mg (has no administration in time range)  heparin  injection 5,000 Units (has no administration in time range)  sodium chloride  0.9 % bolus 1,000 mL (0 mLs Intravenous Stopped 06/18/24 1230)  lactated ringers  bolus 2,132 mL (2,132 mLs Intravenous New Bag/Given 06/18/24 1409)                                                                                                                                      Procedures .Critical Care  Performed by: Francesca Elsie CROME, MD Authorized by: Francesca Elsie CROME, MD   Critical care provider statement:    Critical care time (minutes):  30   Critical care was  necessary to treat or prevent imminent or life-threatening deterioration of the following conditions:  Metabolic crisis   Critical care was time spent personally by me on the following activities:  Development of treatment plan with patient or surrogate, discussions with consultants, evaluation of patient's response to treatment, examination of patient, ordering and review of laboratory studies, ordering and review of radiographic studies, ordering and performing treatments and interventions, pulse oximetry, re-evaluation of patient's condition and review of old charts   Care discussed with: admitting provider     (including critical care time)  Medical Decision Making / ED Course   MDM:  43 year old presenting to the emergency department for high blood sugar.  Patient overall well-appearing, physical examination with no focal abnormality other than mild dry mouth.  No tachycardia.  Suspect likely new onset diabetes.  Given age and obesity, suspect type 2 diabetes.  History not very concerning for DKA but will check labs.  Patient not tachypneic or vomiting.  Patient likely dehydrated due to diabetes.  Will reassess.  Blood sugar improving and no evidence of severe dehydration may be able to discharge with close outpatient follow-up.  He reports that he is follow-up with his primary doctor in 2 days.  Clinical Course as of 06/18/24 1431  Sun Jun 18, 2024  1430 Labs do show AKI, pseudohyponatremia, very elevated glucose to 800, as well as anion gap mild low CO2.  Suspect very mild DKA, but given significant hyperglycemia, I think patient would benefit from admission.  Will start insulin  drip.  Discussed with Dr. Zella who is admitted patient. [WS]    Clinical Course User Index [WS]  Francesca Elsie CROME, MD     Additional history obtained: -Additional history obtained from family -External records from outside source obtained and reviewed including: Chart review including previous notes, labs, imaging, consultation notes including prior notes    Lab Tests: -I ordered, reviewed, and interpreted labs.   The pertinent results include:   Labs Reviewed  CBC - Abnormal; Notable for the following components:      Result Value   RBC 5.92 (*)    All other components within normal limits  URINALYSIS, ROUTINE W REFLEX MICROSCOPIC - Abnormal; Notable for the following components:   Color, Urine COLORLESS (*)    Glucose, UA >=500 (*)    Ketones, ur 5 (*)    All other components within normal limits  COMPREHENSIVE METABOLIC PANEL WITH GFR - Abnormal; Notable for the following components:   Sodium 127 (*)    Chloride 86 (*)    CO2 20 (*)    Glucose, Bld 855 (*)    Creatinine, Ser 1.56 (*)    Total Protein 8.7 (*)    Alkaline Phosphatase 192 (*)    GFR, Estimated 56 (*)    Anion gap 21 (*)    All other components within normal limits  BETA-HYDROXYBUTYRIC ACID - Abnormal; Notable for the following components:   Beta-Hydroxybutyric Acid 1.70 (*)    All other components within normal limits  CBG MONITORING, ED - Abnormal; Notable for the following components:   Glucose-Capillary >600 (*)    All other components within normal limits  CBG MONITORING, ED - Abnormal; Notable for the following components:   Glucose-Capillary >600 (*)    All other components within normal limits  CBG MONITORING, ED - Abnormal; Notable for the following components:   Glucose-Capillary 555 (*)    All other components within normal limits  BLOOD GAS, VENOUS  BASIC METABOLIC PANEL  WITH GFR  BASIC METABOLIC PANEL WITH GFR  BASIC METABOLIC PANEL WITH GFR  CBG MONITORING, ED    Notable for hyperglycemia, anion gap   Medicines ordered and prescription drug management: Meds ordered this  encounter  Medications   sodium chloride  0.9 % bolus 1,000 mL   lactated ringers  bolus 2,132 mL   insulin  regular, human (MYXREDLIN ) 100 units/ 100 mL infusion    EndoTool Goal Range::   140-180    Type of Diabetes:   Type 2    Mode of Therapy:   ENDOX1 for DKA    Start Method:   EndoTool to calculate   lactated ringers  infusion   dextrose  5 % in lactated ringers  infusion   dextrose  50 % solution 0-50 mL   potassium chloride  10 mEq in 100 mL IVPB   amLODipine  (NORVASC ) tablet 10 mg   OR Linked Order Group    acetaminophen  (TYLENOL ) tablet 650 mg    acetaminophen  (TYLENOL ) suppository 650 mg   OR Linked Order Group    ondansetron  (ZOFRAN ) tablet 4 mg    ondansetron  (ZOFRAN ) injection 4 mg   albuterol  (PROVENTIL ) (2.5 MG/3ML) 0.083% nebulizer solution 2.5 mg   heparin  injection 5,000 Units    -I have reviewed the patients home medicines and have made adjustments as needed   Consultations Obtained: I requested consultation with the hospitalist,  and discussed lab and imaging findings as well as pertinent plan - they recommend: admission   Cardiac Monitoring: The patient was maintained on a cardiac monitor.  I personally viewed and interpreted the cardiac monitored which showed an underlying rhythm of: NSR  Social Determinants of Health:  Diagnosis or treatment significantly limited by social determinants of health: obesity   Reevaluation: After the interventions noted above, I reevaluated the patient and found that their symptoms have improved  Co morbidities that complicate the patient evaluation  Past Medical History:  Diagnosis Date   Frequent headaches    Hypertension       Dispostion: Disposition decision including need for hospitalization was considered, and patient admitted to the hospital.    Final Clinical Impression(s) / ED Diagnoses Final diagnoses:  Diabetic ketoacidosis without coma associated with type 2 diabetes mellitus (HCC)  Hyperglycemia      This chart was dictated using voice recognition software.  Despite best efforts to proofread,  errors can occur which can change the documentation meaning.    Francesca Elsie CROME, MD 06/18/24 1431

## 2024-06-18 NOTE — ED Triage Notes (Signed)
 Patient reports drinking a lot of water (getting up a lot at night) to pee. Also, having blurry vision (mainly at night when driving and looking into the distance). No prediabetes. No Glucose problems. No nausea or vomiting. No dizziness. PCP appt made for this week (did not reach out to Triage service with PCP).

## 2024-06-19 ENCOUNTER — Other Ambulatory Visit (HOSPITAL_COMMUNITY): Payer: Self-pay

## 2024-06-19 ENCOUNTER — Telehealth (HOSPITAL_COMMUNITY): Payer: Self-pay | Admitting: Pharmacy Technician

## 2024-06-19 DIAGNOSIS — E1010 Type 1 diabetes mellitus with ketoacidosis without coma: Secondary | ICD-10-CM | POA: Diagnosis not present

## 2024-06-19 LAB — BASIC METABOLIC PANEL WITH GFR
Anion gap: 12 (ref 5–15)
Anion gap: 14 (ref 5–15)
Anion gap: 13 (ref 5–15)
BUN: 9 mg/dL (ref 6–20)
BUN: 8 mg/dL (ref 6–20)
BUN: 9 mg/dL (ref 6–20)
CO2: 24 mmol/L (ref 22–32)
CO2: 21 mmol/L — ABNORMAL LOW (ref 22–32)
CO2: 22 mmol/L (ref 22–32)
Calcium: 9.3 mg/dL (ref 8.9–10.3)
Calcium: 9.1 mg/dL (ref 8.9–10.3)
Calcium: 8.9 mg/dL (ref 8.9–10.3)
Chloride: 103 mmol/L (ref 98–111)
Chloride: 103 mmol/L (ref 98–111)
Chloride: 101 mmol/L (ref 98–111)
Creatinine, Ser: 1.1 mg/dL (ref 0.61–1.24)
Creatinine, Ser: 1.08 mg/dL (ref 0.61–1.24)
Creatinine, Ser: 1.12 mg/dL (ref 0.61–1.24)
GFR, Estimated: >60 mL/min (ref >60)
GFR, Estimated: >60 mL/min (ref >60)
GFR, Estimated: >60 mL/min (ref >60)
Glucose, Bld: 180 mg/dL — ABNORMAL HIGH (ref 70–99)
Glucose, Bld: 158 mg/dL — ABNORMAL HIGH (ref 70–99)
Glucose, Bld: 310 mg/dL — ABNORMAL HIGH (ref 70–99)
Potassium: 3.4 mmol/L — ABNORMAL LOW (ref 3.5–5.1)
Potassium: 3.3 mmol/L — ABNORMAL LOW (ref 3.5–5.1)
Potassium: 3.7 mmol/L (ref 3.5–5.1)
Sodium: 139 mmol/L (ref 135–145)
Sodium: 138 mmol/L (ref 135–145)
Sodium: 136 mmol/L (ref 135–145)

## 2024-06-19 LAB — HEMOGLOBIN A1C
Hgb A1c MFr Bld: 9.3 % — ABNORMAL HIGH (ref 4.8–5.6)
Mean Plasma Glucose: 220.21 mg/dL

## 2024-06-19 LAB — HIV ANTIBODY (ROUTINE TESTING W REFLEX): HIV Screen 4th Generation wRfx: NONREACTIVE

## 2024-06-19 LAB — CBC
HCT: 44 % (ref 39.0–52.0)
Hemoglobin: 14.4 g/dL (ref 13.0–17.0)
MCH: 28.7 pg (ref 26.0–34.0)
MCHC: 32.7 g/dL (ref 30.0–36.0)
MCV: 87.8 fL (ref 80.0–100.0)
Platelets: 250 K/uL (ref 150–400)
RBC: 5.01 MIL/uL (ref 4.22–5.81)
RDW: 12.1 % (ref 11.5–15.5)
WBC: 9.8 K/uL (ref 4.0–10.5)
nRBC: 0 % (ref 0.0–0.2)

## 2024-06-19 LAB — GLUCOSE, CAPILLARY
Glucose-Capillary: 156 mg/dL — ABNORMAL HIGH (ref 70–99)
Glucose-Capillary: 176 mg/dL — ABNORMAL HIGH (ref 70–99)
Glucose-Capillary: 249 mg/dL — ABNORMAL HIGH (ref 70–99)
Glucose-Capillary: 260 mg/dL — ABNORMAL HIGH (ref 70–99)
Glucose-Capillary: 328 mg/dL — ABNORMAL HIGH (ref 70–99)
Glucose-Capillary: 364 mg/dL — ABNORMAL HIGH (ref 70–99)

## 2024-06-19 LAB — MAGNESIUM: Magnesium: 1.8 mg/dL (ref 1.7–2.4)

## 2024-06-19 MED ORDER — CHLORHEXIDINE GLUCONATE CLOTH 2 % EX PADS: 6.0000 | MEDICATED_PAD | Freq: Every day | CUTANEOUS | Status: DC

## 2024-06-19 MED ORDER — INSULIN STARTER KIT- PEN NEEDLES (ENGLISH)
1.0000 | Freq: Once | Status: AC
  Administered 2024-06-19: 1
  Filled 2024-06-19: qty 1

## 2024-06-19 MED ORDER — INSULIN ASPART 100 UNIT/ML IJ SOLN
0.0000 [IU] | Freq: Three times a day (TID) | INTRAMUSCULAR | Status: DC
  Administered 2024-06-19: 8 [IU] via SUBCUTANEOUS
  Administered 2024-06-19: 15 [IU] via SUBCUTANEOUS
  Administered 2024-06-19 – 2024-06-20 (×2): 11 [IU] via SUBCUTANEOUS
  Administered 2024-06-20: 8 [IU] via SUBCUTANEOUS

## 2024-06-19 MED ORDER — INSULIN ASPART 100 UNIT/ML IJ SOLN
0.0000 [IU] | Freq: Every day | INTRAMUSCULAR | Status: DC
  Administered 2024-06-19: 5 [IU] via SUBCUTANEOUS

## 2024-06-19 MED ORDER — LIVING WELL WITH DIABETES BOOK
Freq: Once | Status: AC
  Filled 2024-06-19: qty 1

## 2024-06-19 MED ORDER — INSULIN GLARGINE 100 UNIT/ML ~~LOC~~ SOLN
15.0000 [IU] | Freq: Once | SUBCUTANEOUS | Status: DC
  Filled 2024-06-19: qty 0.15

## 2024-06-19 MED ORDER — INSULIN GLARGINE 100 UNIT/ML ~~LOC~~ SOLN
15.0000 [IU] | Freq: Every day | SUBCUTANEOUS | Status: DC
  Administered 2024-06-19 – 2024-06-20 (×2): 15 [IU] via SUBCUTANEOUS
  Filled 2024-06-19 (×2): qty 0.15

## 2024-06-19 MED ORDER — INSULIN GLARGINE 100 UNIT/ML ~~LOC~~ SOLN
5.0000 [IU] | Freq: Once | SUBCUTANEOUS | Status: AC
Start: 2024-06-19 — End: 2024-06-19
  Administered 2024-06-19: 5 [IU] via SUBCUTANEOUS
  Filled 2024-06-19: qty 0.05

## 2024-06-19 MED ORDER — INSULIN GLARGINE-YFGN 100 UNIT/ML ~~LOC~~ SOLN: 5.0000 [IU] | Freq: Once | SUBCUTANEOUS | Status: DC

## 2024-06-19 NOTE — Inpatient Diabetes Management (Addendum)
 Inpatient Diabetes Program Recommendations  AACE/ADA: New Consensus Statement on Inpatient Glycemic Control (2015)  Target Ranges:  Prepandial:   less than 140 mg/dL      Peak postprandial:   less than 180 mg/dL (1-2 hours)      Critically ill patients:  140 - 180 mg/dL   Lab Results  Component Value Date   GLUCAP 328 (H) 06/19/2024    Review of Glycemic Control  Latest Reference Range & Units 06/18/24 11:08  Glucose 70 - 99 mg/dL 144 (HH)  (HH): Data is critically high  Latest Reference Range & Units 06/18/24 12:28  Beta-Hydroxybutyric Acid 0.05 - 0.27 mmol/L 1.70 (H)  (H): Data is abnormally high  Latest Reference Range & Units 06/18/24 21:28 06/18/24 22:36 06/18/24 23:30 06/19/24 00:52 06/19/24 02:50 06/19/24 07:45  Glucose-Capillary 70 - 99 mg/dL 822 (H) 821 (H) 820 (H) 176 (H) 156 (H) 328 (H)  (H): Data is abnormally high  Diabetes history: New DM2  Current orders for Inpatient glycemic control: Novolog  0-15 units TID and 0-5 units QHS  Inpatient Diabetes Program Recommendations:    Transitioned off of IV insulin  lasy evening.  Was given Glargine 5 units with transition.    Please increase Glargine:  15 units every day  Ordered LWWDM booklet and Insulin  Starter Kit.  A1C is pending.  Will see him today.  Addendum@14 :00:  Met with patient at bedside and significant other is on speaker phone.  He states the last 4-5 days he has been voiding frequently and having increased thirst; he has alos had blurred vision.  Has notice some weight loss.  Spoke with  them about new diagnosis. Discussed A1C results with them and explained what an A1C is, basic pathophysiology of DM Type 2, basic home care, basic diabetes diet nutrition principles, importance of checking CBGs and maintaining good CBG control to prevent long-term and short-term complications. Reviewed signs and symptoms of hyperglycemia and hypoglycemia and how to treat hypoglycemia at home. Also reviewed blood sugar goals  at home.  RNs to provide ongoing basic DM education at bedside. Have also placed RD consult for DM diet education for this patient.    Educated on The Plate Method, CHO's, portion control, avoiding caloric beverages, CBGs at home fasting and mid afternoon, F/U with PCP every 3 months, bring meter to PCP office, long and short term complications of uncontrolled BG, and importance of exercise.  Significant other is at work but will be here in the morning.  She is a Engineer, site and is family with the insulin  pen and the [employer].  Will also need a glucometer.  Asked or TOC pharmacy to start a benefit check on insulins and the CGM.    Will follow up tomorrow for continued education.    Thank you, Wyvonna Pinal, MSN, CDCES Diabetes Coordinator Inpatient Diabetes Program [phone] (team pager from 8a-5p)

## 2024-06-19 NOTE — Plan of Care (Signed)

## 2024-06-19 NOTE — Plan of Care (Signed)
  Problem: Clinical Measurements: Goal: Diagnostic test results will improve Outcome: Progressing Goal: Respiratory complications will improve Outcome: Progressing Goal: Cardiovascular complication will be avoided Outcome: Progressing   Problem: Activity: Goal: Risk for activity intolerance will decrease Outcome: Progressing   Problem: Elimination: Goal: Will not experience complications related to urinary retention Outcome: Progressing   Problem: Safety: Goal: Ability to remain free from injury will improve Outcome: Progressing

## 2024-06-19 NOTE — Progress Notes (Signed)
 [facility] PROGRESS NOTE   Thomas King FMW:[id number] DOB: 06-Sep-1981 DOA: 06/18/2024  PCP: Antonio Cyndee Jamee JONELLE, DO  Brief History: 43 y.o. male with medical history significant for hypertension and family history of diabetes presents to the hospital complaining of 4 days of fatigue, polydipsia, polyuria, and blurry vision found to have hyperglycemia and diabetic ketoacidosis.  He was hospitalized for further management.   Consultants: None  Procedures: None    Subjective/Interval History: Patient feels tired this morning but denies any nausea or vomiting.  No shortness of breath or chest pain.  His fiance is at the bedside.    Assessment/Plan:  New onset diabetes mellitus with mild DKA Presented with complaints of fatigue polydipsia polyuria.  Was found to have a glucose level of 855.  Bicarbonate was 28.  Anion gap was 21.  Beta hydroxybutyrate acid level was mildly elevated. Patient thought to have mild DKA.  Patient was placed on insulin  infusion and given IV fluids. His glucose levels improved.  He was transitioned to basal insulin . HbA1c is noted to be 9.3. Patient to be seen by diabetes coordinator. Will likely need to be on insulin  going forward.  Will need close follow-up with PCP.  Essential hypertension Continue amlodipine . Sinus bradycardia as noted.  Acute kidney injury Resolved.  Hypokalemia Resolved.  Will check magnesium levels  Class I obesity Estimated body mass index is 34.7 kg/m as calculated from the following:   Height as of this encounter: 5' 9 (1.753 m).   Weight as of this encounter: 106.6 kg.   DVT Prophylaxis: Subcutaneous heparin  Code Status: Full code Family Communication: Discussed with patient Disposition Plan: Hopefully return home when improved  Status is: Observation The patient will require care spanning > 2 midnights and should be moved to inpatient because: Continued management of hyperglycemia and new onset  diabetes      Medications: Scheduled:  amLODipine   10 mg Oral Daily   Chlorhexidine  Gluconate Cloth  6 each Topical Daily   heparin   5,000 Units Subcutaneous Q8H   insulin  aspart  0-15 Units Subcutaneous TID WC   insulin  aspart  0-5 Units Subcutaneous QHS   insulin  glargine  15 Units Subcutaneous Daily   insulin  starter kit- pen needles  1 kit Other Once   living well with diabetes book   Does not apply Once   Continuous: PRN:acetaminophen  **OR** acetaminophen , albuterol , dextrose , ondansetron  **OR** ondansetron  (ZOFRAN ) IV  Antibiotics: Anti-infectives (From admission, onward)    None       Objective:  Vital Signs  Vitals:   06/18/24 1800 06/18/24 2018 06/19/24 0000 06/19/24 0743  BP: (!) 138/98 (!) 154/89 (!) 141/87   Pulse: 81 76 64   Resp: (!) 21 19 18    Temp:  98.9 F (37.2 C)  98.1 F (36.7 C)  TempSrc:  Oral  Oral  SpO2: 94% 97% 93%   Weight:      Height:        Intake/Output Summary (Last 24 hours) at 06/19/2024 0913 Last data filed at 06/18/2024 2332 Gross per 24 hour  Intake 2200.52 ml  Output --  Net 2200.52 ml   Filed Weights   06/18/24 1048 06/18/24 1053  Weight: 105.7 kg 106.6 kg    General appearance: Awake alert.  In no distress Resp: Clear to auscultation bilaterally.  Normal effort Cardio: S1-S2 is normal regular.  No S3-S4.  No rubs murmurs or bruit GI: Abdomen is soft.  Nontender nondistended.  Bowel sounds are present normal.  No masses organomegaly Extremities: No edema.  Full range of motion of lower extremities. Neurologic: Alert and oriented x3.  No focal neurological deficits.    Lab Results:  Data Reviewed: I have personally reviewed following labs and reports of the imaging studies  CBC: Recent Labs  Lab 06/18/24 1108 06/19/24 0517  WBC 9.6 9.8  HGB 16.7 14.4  HCT 50.2 44.0  MCV 84.8 87.8  PLT 314 250    Basic Metabolic Panel: Recent Labs  Lab 06/18/24 1656 06/18/24 1858 06/18/24 2327 06/19/24 0248  06/19/24 0517  NA 137 138 139 138 136  K 3.8 3.8 3.4* 3.3* 3.7  CL 100 101 103 103 101  CO2 24 24 24  21* 22  GLUCOSE 314* 258* 180* 158* 310*  BUN 13 12 9 8 9   CREATININE 1.26* 1.18 1.10 1.08 1.12  CALCIUM 9.8 9.7 9.3 9.1 8.9    GFR: Estimated Creatinine Clearance: 102.4 mL/min (by C-G formula based on SCr of 1.12 mg/dL).  Liver Function Tests: Recent Labs  Lab 06/18/24 1108  AST 30  ALT 37  ALKPHOS 192*  BILITOT 0.6  PROT 8.7*  ALBUMIN 4.7     HbA1C: Recent Labs    06/19/24 0517  HGBA1C 9.3*    CBG: Recent Labs  Lab 06/18/24 2236 06/18/24 2330 06/19/24 0052 06/19/24 0250 06/19/24 0745  GLUCAP 178* 179* 176* 156* 328*    Radiology Studies: No results found.     LOS: 1 day   Thomas King  [facility] Pager on [email or web address]  06/19/2024, 9:13 AM

## 2024-06-19 NOTE — Telephone Encounter (Signed)
 Patient Product/process development scientist completed.    The patient is insured through [facility]. Patient has [employer], may use a copay card, and/or apply for patient assistance if available.    Ran test claim for Lantus  Pen and the current 30 day co-pay is $5.00.  Ran test claim for Novolog  FlexPen and the current 30 day co-pay is $5.00.  Ran test claim for Dexcom G7 Sensors and the current 30 day co-pay is $5.00.  Ran test claim for [employer] 3 Plus Sensors and tProduct Not Covered   This test claim was processed through [employer]- copay amounts may vary at other pharmacies due to [employer], or as the patient moves through the different stages of their insurance plan.     Reyes Sharps, CPHT Pharmacy Technician III Certified Patient Advocate [facility] Patient Advocate Team Direct Number: [phone]  Fax: [phone]

## 2024-06-20 ENCOUNTER — Ambulatory Visit (INDEPENDENT_AMBULATORY_CARE_PROVIDER_SITE_OTHER): Admitting: Family Medicine

## 2024-06-20 ENCOUNTER — Other Ambulatory Visit (HOSPITAL_COMMUNITY): Payer: Self-pay

## 2024-06-20 ENCOUNTER — Encounter: Payer: Self-pay | Admitting: Family Medicine

## 2024-06-20 VITALS — BP 145/101 | HR 90 | Temp 98.9°F | Resp 12 | Ht 69.0 in | Wt 237.0 lb

## 2024-06-20 DIAGNOSIS — E1010 Type 1 diabetes mellitus with ketoacidosis without coma: Secondary | ICD-10-CM | POA: Diagnosis not present

## 2024-06-20 DIAGNOSIS — E1110 Type 2 diabetes mellitus with ketoacidosis without coma: Secondary | ICD-10-CM

## 2024-06-20 DIAGNOSIS — E785 Hyperlipidemia, unspecified: Secondary | ICD-10-CM

## 2024-06-20 DIAGNOSIS — I10 Essential (primary) hypertension: Secondary | ICD-10-CM

## 2024-06-20 DIAGNOSIS — E1065 Type 1 diabetes mellitus with hyperglycemia: Secondary | ICD-10-CM

## 2024-06-20 LAB — BASIC METABOLIC PANEL WITH GFR
Anion gap: 14 (ref 5–15)
BUN: 7 mg/dL (ref 6–20)
CO2: 22 mmol/L (ref 22–32)
Calcium: 8.9 mg/dL (ref 8.9–10.3)
Chloride: 101 mmol/L (ref 98–111)
Creatinine, Ser: 1.01 mg/dL (ref 0.61–1.24)
GFR, Estimated: >60 mL/min (ref >60)
Glucose, Bld: 210 mg/dL — ABNORMAL HIGH (ref 70–99)
Potassium: 3.3 mmol/L — ABNORMAL LOW (ref 3.5–5.1)
Sodium: 138 mmol/L (ref 135–145)

## 2024-06-20 LAB — GLUCOSE, CAPILLARY
Glucose-Capillary: 266 mg/dL — ABNORMAL HIGH (ref 70–99)
Glucose-Capillary: 313 mg/dL — ABNORMAL HIGH (ref 70–99)

## 2024-06-20 LAB — MAGNESIUM: Magnesium: 2 mg/dL (ref 1.7–2.4)

## 2024-06-20 MED ORDER — LANCETS MISC
1.0000 | Freq: Three times a day (TID) | 0 refills | Status: AC
  Filled 2024-06-20: qty 100, 33d supply, fill #0

## 2024-06-20 MED ORDER — BLOOD GLUCOSE TEST VI STRP
1.0000 | ORAL_STRIP | Freq: Three times a day (TID) | 0 refills | Status: AC
  Filled 2024-06-20: qty 100, 34d supply, fill #0

## 2024-06-20 MED ORDER — POTASSIUM CHLORIDE CRYS ER 20 MEQ PO TBCR
40.0000 meq | EXTENDED_RELEASE_TABLET | ORAL | Status: AC
  Administered 2024-06-20 (×2): 40 meq via ORAL
  Filled 2024-06-20 (×2): qty 2

## 2024-06-20 MED ORDER — PEN NEEDLES 31G X 5 MM MISC
1.0000 | Freq: Three times a day (TID) | 0 refills | Status: AC
  Filled 2024-06-20: qty 100, 33d supply, fill #0

## 2024-06-20 MED ORDER — INSULIN GLARGINE 100 UNIT/ML SOLOSTAR PEN
22.0000 [IU] | PEN_INJECTOR | Freq: Every day | SUBCUTANEOUS | 2 refills | Status: DC
  Filled 2024-06-20: qty 15, 68d supply, fill #0

## 2024-06-20 MED ORDER — FREESTYLE LIBRE 3 PLUS SENSOR MISC: 3 refills | Status: DC

## 2024-06-20 MED ORDER — TIRZEPATIDE 2.5 MG/0.5ML ~~LOC~~ SOAJ: 2.5000 mg | SUBCUTANEOUS | 0 refills | Status: DC

## 2024-06-20 MED ORDER — INSULIN ASPART 100 UNIT/ML FLEXPEN
5.0000 [IU] | PEN_INJECTOR | Freq: Three times a day (TID) | SUBCUTANEOUS | 0 refills | Status: DC
  Filled 2024-06-20: qty 15, 30d supply, fill #0

## 2024-06-20 MED ORDER — BLOOD GLUCOSE MONITOR SYSTEM W/DEVICE KIT
1.0000 | PACK | Freq: Three times a day (TID) | 0 refills | Status: AC
  Filled 2024-06-20: qty 1, 30d supply, fill #0

## 2024-06-20 MED ORDER — POTASSIUM CHLORIDE CRYS ER 20 MEQ PO TBCR
20.0000 meq | EXTENDED_RELEASE_TABLET | Freq: Every day | ORAL | 0 refills | Status: AC
  Filled 2024-06-20: qty 5, 5d supply, fill #0

## 2024-06-20 MED ORDER — PHENOL 1.4 % MT LIQD
1.0000 | OROMUCOSAL | Status: DC | PRN
  Administered 2024-06-20: 1 via OROMUCOSAL
  Filled 2024-06-20: qty 177

## 2024-06-20 MED ORDER — LANCET DEVICE MISC
1.0000 | Freq: Three times a day (TID) | 0 refills | Status: AC
  Filled 2024-06-20: qty 1, fill #0

## 2024-06-20 NOTE — Progress Notes (Signed)
 Discharge meds in a secure bag delivered to pt in room by this RN

## 2024-06-20 NOTE — Discharge Summary (Signed)
 [facility]  Physician Discharge Summary   Patient ID: Thomas King MRN: [id number] DOB/AGE: [birth year] 43 y.o.  Admit date: 06/18/2024 Discharge date:   06/20/2024   PCP: Antonio Cyndee Jamee JONELLE, DO  DISCHARGE DIAGNOSES:    DKA (diabetic ketoacidosis) (HCC) Diabetes mellitus type 2  RECOMMENDATIONS FOR OUTPATIENT FOLLOW UP: Instructions regarding insulin  treatment and glucose monitoring provided to the patient Patient encouraged to keep his appointment with his PCP this afternoon.   Home Health: None Equipment/Devices: None  CODE STATUS: Full code  DISCHARGE CONDITION: fair  Diet recommendation: Modified carbohydrate  INITIAL HISTORY: 43 y.o. male with medical history significant for hypertension and family history of diabetes presents to the hospital complaining of 4 days of fatigue, polydipsia, polyuria, and blurry vision found to have hyperglycemia and diabetic ketoacidosis.  He was hospitalized for further management.   HOSPITAL COURSE:   New onset diabetes mellitus with mild DKA Presented with complaints of fatigue polydipsia polyuria.  Was found to have a glucose level of 855.  Bicarbonate was 28.  Anion gap was 21.  Beta hydroxybutyrate acid level was mildly elevated. Patient thought to have mild DKA.  Patient was placed on insulin  infusion and given IV fluids. His glucose levels improved.  He was transitioned to basal insulin . HbA1c is noted to be 9.3. Patient has been seen by diabetes coordinator.  He will need to be on insulin  going forward.  He has follow-up with his PCP this afternoon.  Prescriptions have been sent to [facility] pharmacy.   Essential hypertension Continue amlodipine .   Acute kidney injury Resolved.   Hypokalemia Additional supplementation ordered.  Magnesium is 2.0.   Class I obesity Estimated body mass index is 34.7 kg/m as calculated from the following:   Height as of this encounter: 5' 9 (1.753 m).   Weight as of this encounter:  106.6 kg.  Patient is stable.  Okay for discharge home today.  Discussed with patient and his fiance.  He has an appointment with his PCP this afternoon.   PERTINENT LABS:  The results of significant diagnostics from this hospitalization (including imaging, microbiology, ancillary and laboratory) are listed below for reference.    Labs:   Basic Metabolic Panel: Recent Labs  Lab 06/18/24 1858 06/18/24 2327 06/19/24 0248 06/19/24 0517 06/20/24 0433  NA 138 139 138 136 138  K 3.8 3.4* 3.3* 3.7 3.3*  CL 101 103 103 101 101  CO2 24 24 21* 22 22  GLUCOSE 258* 180* 158* 310* 210*  BUN 12 9 8 9 7   CREATININE 1.18 1.10 1.08 1.12 1.01  CALCIUM 9.7 9.3 9.1 8.9 8.9  MG  --   --   --  1.8 2.0   Liver Function Tests: Recent Labs  Lab 06/18/24 1108  AST 30  ALT 37  ALKPHOS 192*  BILITOT 0.6  PROT 8.7*  ALBUMIN 4.7    CBC: Recent Labs  Lab 06/18/24 1108 06/19/24 0517  WBC 9.6 9.8  HGB 16.7 14.4  HCT 50.2 44.0  MCV 84.8 87.8  PLT 314 250     CBG: Recent Labs  Lab 06/19/24 0745 06/19/24 1123 06/19/24 1552 06/19/24 2140 06/20/24 0725  GLUCAP 328* 364* 260* 249* 266*     IMAGING STUDIES No results found.  DISCHARGE EXAMINATION: Vitals:   06/19/24 1500 06/19/24 1553 06/19/24 2121 06/20/24 0630  BP: 132/84 (!) 154/105 (!) 135/97 137/74  Pulse: 63 77 69 73  Resp:  17 18 18   Temp:  98.4 F (36.9 C)  98.7 F (37.1 C) 98.1 F (36.7 C)  TempSrc:  Oral Oral Oral  SpO2: 94% 100% 97% 95%  Weight:      Height:       General appearance: Awake alert.  In no distress Resp: Clear to auscultation bilaterally.  Normal effort Cardio: S1-S2 is normal regular.  No S3-S4.  No rubs murmurs or bruit GI: Abdomen is soft.  Nontender nondistended.  Bowel sounds are present normal.  No masses organomegaly   DISPOSITION: Home  Discharge Instructions     Call MD for:  difficulty breathing, headache or visual disturbances   Complete by: As directed    Call MD for:   extreme fatigue   Complete by: As directed    Call MD for:  persistant dizziness or light-headedness   Complete by: As directed    Call MD for:  persistant nausea and vomiting   Complete by: As directed    Call MD for:  severe uncontrolled pain   Complete by: As directed    Call MD for:  temperature >100.4   Complete by: As directed    Diet Carb Modified   Complete by: As directed    Discharge instructions   Complete by: As directed    Please keep your appointment with the primary care provider this afternoon.  Take your medications as prescribed.  Please be sure to monitor and check your glucose levels as instructed.  You were cared for by a hospitalist during your hospital stay. If you have any questions about your discharge medications or the care you received while you were in the hospital after you are discharged, you can call the unit and asked to speak with the hospitalist on call if the hospitalist that took care of you is not available. Once you are discharged, your primary care physician will handle any further medical issues. Please note that NO REFILLS for any discharge medications will be authorized once you are discharged, as it is imperative that you return to your primary care physician (or establish a relationship with a primary care physician if you do not have one) for your aftercare needs so that they can reassess your need for medications and monitor your lab values. If you do not have a primary care physician, you can call [phone] for a physician referral.   Increase activity slowly   Complete by: As directed          Allergies as of 06/20/2024       Reactions   Other Shortness Of Breath, Rash   ALL SEAFOOD   Shellfish Allergy Rash, Shortness Of Breath, Hives        Medication List     STOP taking these medications    Zepbound  2.5 MG/0.5ML Pen Generic drug: tirzepatide        TAKE these medications    amLODipine  10 MG tablet Commonly known as:  NORVASC  Take 1 tablet (10 mg total) by mouth daily.   Blood Glucose Monitoring Suppl Devi 1 each by Does not apply route 3 (three) times daily. May dispense any manufacturer covered by patient's insurance.   BLOOD GLUCOSE TEST STRIPS Strp 1 each by Does not apply route 3 (three) times daily. Use as directed to check blood sugar. May dispense any manufacturer covered by patient's insurance and fits patient's device.   Ciclopirox  1 % shampoo Massage into scalp , wait 5 min then rinse, 2-3 x a week   insulin  aspart 100 UNIT/ML FlexPen Commonly known as: NOVOLOG  Inject  5 Units into the skin 3 (three) times daily with meals. If eating and Blood Glucose (BG) 80 or higher inject 5 units for meal coverage and add correction dose per scale. If not eating, correction dose only. BG <150= 0 unit; BG 150-200= 1 unit; BG 201-250= 2 unit; BG 251-300= 3 unit; BG 301-350= 4 unit; BG 351-400= 5 unit; BG >400= 6 unit and Call Primary Care.   insulin  glargine 100 UNIT/ML Solostar Pen Commonly known as: LANTUS  Inject 22 Units into the skin daily. May substitute as needed per insurance.   Lancet Device Misc 1 each by Does not apply route 3 (three) times daily. May dispense any manufacturer covered by patient's insurance.   Lancets Misc 1 each by Does not apply route 3 (three) times daily. Use as directed to check blood sugar. May dispense any manufacturer covered by patient's insurance and fits patient's device.   naproxen  500 MG tablet Commonly known as: NAPROSYN  Take 1 tablet (500 mg total) by mouth 2 (two) times daily as needed (fever or body aches).   Pen Needles 31G X 5 MM Misc 1 each by Does not apply route 3 (three) times daily. May dispense any manufacturer covered by patient's insurance.   potassium chloride  SA 20 MEQ tablet Commonly known as: KLOR-CON  M Take 1 tablet (20 mEq total) by mouth daily for 5 days.          Follow-up Information     Antonio Cyndee Jamee JONELLE, DO Follow up.    Specialty: Family Medicine Contact information: [phone] [place] [address] [phone]                 TOTAL DISCHARGE TIME: 35 minutes  Kathrynn Backstrom Verdene  [facility] Pager on [email or web address]  06/20/2024, 10:39 AM

## 2024-06-20 NOTE — Progress Notes (Signed)
 Subjective:    Patient ID: Thomas King, male    DOB: 1981-09-10, 43 y.o.   MRN: [id number]  Chief Complaint  Patient presents with   Hospitalization Follow-up    HPI Patient is in today for hosp f/u.  Discussed the use of AI scribe software for clinical note transcription with the patient, who gave verbal consent to proceed.  History of Present Illness Thomas King is a 43 year old male who presents with polyuria, polydipsia, and blurred vision.  He has been experiencing frequent urination and excessive thirst, with a family member noting that he gets up approximately seven times a night to urinate and is constantly drinking fluids. The duration of these symptoms is unspecified.  He was recently hospitalized with a blood sugar level of 850 mg/dL after initially being found to have a blood sugar level above 600 mg/dL at an urgent care visit. This was a significant increase from a previous normal blood sugar reading of around 80 mg/dL in May.  He experiences night sweats severe enough to soak the bed and has noticed weight loss, as his face appears slimmer. He has been experiencing blurred vision since last Friday, which has persisted.  Current medications include Novolog , Lantus , and potassium supplements. There is an issue with a glucose monitoring device that is not functioning properly, and he prefers a different device that is not covered by insurance.    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  Tobacco Use   Smoking status: Never   Smokeless tobacco: Never  Vaping Use   Vaping status: Never Used  Substance and Sexual Activity   Alcohol use: Not Currently   Drug use: No   Sexual activity: Yes    Partners: Female    Birth control/protection: Condom  Other Topics Concern   Not on file  Social History Narrative   Exercise-- occassional   Pt lives with family    Pt works    Social Drivers of Corporate investment banker Strain: Not on file  Food Insecurity: No Food Insecurity (06/18/2024)   Hunger Vital Sign    Worried About Running Out of Food in the Last Year: Never true    Ran Out of Food in the Last Year: Never true  Transportation Needs: No Transportation Needs (06/18/2024)   PRAPARE - Administrator, Civil Service (Medical): No    Lack of Transportation (Non-Medical): No  Physical Activity: Not on file  Stress: Not on file  Social Connections: Not on file  Intimate Partner Violence: Not At Risk (06/18/2024)   Humiliation, Afraid, Rape, and Kick questionnaire    Fear of Current or Ex-Partner: No    Emotionally Abused: No    Physically Abused: No    Sexually Abused: No  Outpatient Medications Prior to Visit  Medication Sig Dispense Refill   amLODipine  (NORVASC ) 10 MG tablet Take 1 tablet (10 mg total) by mouth daily. 90 tablet 1   Blood Glucose Monitoring Suppl (BLOOD GLUCOSE MONITOR SYSTEM) w/Device KIT Use to check blood sugar three times daily 1 kit 0   Ciclopirox  1 % shampoo Massage into scalp , wait 5 min then rinse, 2-3 x a week 120 mL 0   Glucose Blood (BLOOD GLUCOSE TEST STRIPS) STRP Use to check blood sugar three times daily 100 strip 0   insulin  aspart (NOVOLOG ) 100 UNIT/ML FlexPen Inject 5 Units into the skin 3 (three) times daily with meals. If eating and Blood Glucose (BG) 80 or higher inject 5 units for meal coverage and add correction dose per scale. If not eating,  correction dose only. BG <150= 0 unit; BG 150-200= 1 unit; BG 201-250= 2 unit; BG 251-300= 3 unit; BG 301-350= 4 unit; BG 351-400= 5 unit; BG >400= 6 unit and Call Primary Care. 15 mL 0   insulin  glargine (LANTUS ) 100 UNIT/ML Solostar Pen Inject 22 Units into the skin daily. 15 mL 2   Insulin  Pen Needle (PEN NEEDLES) 31G X 5 MM MISC Use to inject insulin  three times daily 100 each 0   Lancet Device MISC 1 each by Does not apply route 3 (three) times daily. May dispense any manufacturer covered by patient's insurance. 1 each 0   Lancets MISC Use to check blood sugar three times daily 100 each 0   naproxen  (NAPROSYN ) 500 MG tablet Take 1 tablet (500 mg total) by mouth 2 (two) times daily as needed (fever or body aches). 30 tablet 0   potassium chloride  SA (KLOR-CON  M) 20 MEQ tablet Take 1 tablet (20 mEq total) by mouth daily for 5 days. 5 tablet 0   No facility-administered medications prior to visit.    Allergies  Allergen Reactions   Other Shortness Of Breath and Rash    ALL SEAFOOD   Shellfish Allergy Rash, Shortness Of Breath and Hives    Review of Systems  Constitutional:  Negative for chills, fever and malaise/fatigue.  HENT:  Negative for congestion and hearing loss.   Eyes:  Negative for discharge.  Respiratory:  Negative for cough, sputum production and shortness of breath.   Cardiovascular:  Negative for chest pain, palpitations and leg swelling.  Gastrointestinal:  Negative for abdominal pain, blood in stool, constipation, diarrhea, heartburn, nausea and vomiting.  Genitourinary:  Negative for dysuria, frequency, hematuria and urgency.  Musculoskeletal:  Negative for back pain, falls and myalgias.  Skin:  Negative for rash.  Neurological:  Negative for dizziness, sensory change, loss of consciousness, weakness and headaches.  Endo/Heme/Allergies:  Negative for environmental allergies. Does not bruise/bleed easily.  Psychiatric/Behavioral:  Negative for depression and suicidal  ideas. The patient is not nervous/anxious and does not have insomnia.        Objective:    Physical Exam Vitals and nursing note reviewed.  Constitutional:      General: He is not in acute distress.    Appearance: Normal appearance. He is well-developed.  HENT:     Head: Normocephalic and atraumatic.  Eyes:     General: No scleral icterus.       Right eye: No discharge.        Left eye: No discharge.  Cardiovascular:     Rate and Rhythm: Normal rate and regular rhythm.     Heart sounds: No murmur heard. Pulmonary:  Effort: Pulmonary effort is normal. No respiratory distress.     Breath sounds: Normal breath sounds.  Musculoskeletal:        General: Normal range of motion.     Cervical back: Normal range of motion and neck supple.     Right lower leg: No edema.     Left lower leg: No edema.  Skin:    General: Skin is warm and dry.  Neurological:     Mental Status: He is alert and oriented to person, place, and time.  Psychiatric:        Mood and Affect: Mood normal.        Behavior: Behavior normal.        Thought Content: Thought content normal.        Judgment: Judgment normal.     BP (!) 145/101 (BP Location: Left Arm, Patient Position: Sitting, Cuff Size: Large)   Pulse 90   Temp 98.9 F (37.2 C) (Oral)   Resp 12   Ht 5' 9 (1.753 m)   Wt 237 lb (107.5 kg)   SpO2 95%   BMI 35.00 kg/m  Wt Readings from Last 3 Encounters:  06/20/24 237 lb (107.5 kg)  06/18/24 235 lb (106.6 kg)  06/18/24 233 lb (105.7 kg)    Diabetic Foot Exam - Simple   No data filed    Lab Results  Component Value Date   WBC 9.8 06/19/2024   HGB 14.4 06/19/2024   HCT 44.0 06/19/2024   PLT 250 06/19/2024   GLUCOSE 210 (H) 06/20/2024   CHOL 164 03/17/2024   TRIG 234 (H) 03/17/2024   HDL 29 (L) 03/17/2024   LDLDIRECT 89.0 05/07/2022   LDLCALC 100 (H) 03/17/2024   ALT 37 06/18/2024   AST 30 06/18/2024   NA 138 06/20/2024   K 3.3 (L) 06/20/2024   CL 101 06/20/2024    CREATININE 1.01 06/20/2024   BUN 7 06/20/2024   CO2 22 06/20/2024   TSH 1.18 03/17/2024   PSA 0.52 03/17/2024   HGBA1C 9.3 (H) 06/19/2024    Lab Results  Component Value Date   TSH 1.18 03/17/2024   Lab Results  Component Value Date   WBC 9.8 06/19/2024   HGB 14.4 06/19/2024   HCT 44.0 06/19/2024   MCV 87.8 06/19/2024   PLT 250 06/19/2024   Lab Results  Component Value Date   NA 138 06/20/2024   K 3.3 (L) 06/20/2024   CO2 22 06/20/2024   GLUCOSE 210 (H) 06/20/2024   BUN 7 06/20/2024   CREATININE 1.01 06/20/2024   BILITOT 0.6 06/18/2024   ALKPHOS 192 (H) 06/18/2024   AST 30 06/18/2024   ALT 37 06/18/2024   PROT 8.7 (H) 06/18/2024   ALBUMIN 4.7 06/18/2024   CALCIUM 8.9 06/20/2024   ANIONGAP 14 06/20/2024   EGFR 72 03/17/2024   GFR 73.05 05/07/2022   Lab Results  Component Value Date   CHOL 164 03/17/2024   Lab Results  Component Value Date   HDL 29 (L) 03/17/2024   Lab Results  Component Value Date   LDLCALC 100 (H) 03/17/2024   Lab Results  Component Value Date   TRIG 234 (H) 03/17/2024   Lab Results  Component Value Date   CHOLHDL 5.7 (H) 03/17/2024   Lab Results  Component Value Date   HGBA1C 9.3 (H) 06/19/2024       Assessment & Plan:  Diabetic ketoacidosis without coma associated with type 2 diabetes mellitus (HCC) -  Tirzepatide ; Inject 2.5 mg into the skin once a week.  Dispense: 2 mL; Refill: 0  Type 1 diabetes mellitus with hyperglycemia (HCC) -     FreeStyle Libre 3 Plus Sensor; Change sensor every 15 days.  Dispense: 2 each; Refill: 3 -     Amb ref to Medical Nutrition Therapy-MNT  Essential hypertension Assessment & Plan: Poorly controlled will alter medications, encouraged DASH diet, minimize caffeine and obtain adequate sleep. Report concerning symptoms and follow up as directed and as needed    Hyperlipidemia, unspecified hyperlipidemia type Assessment & Plan: Encourage heart healthy diet such as MIND or DASH diet,  increase exercise, avoid trans fats, simple carbohydrates and processed foods, consider a krill or fish or flaxseed oil cap daily.     Assessment and Plan Assessment & Plan Type 2 diabetes mellitus with hyperglycemia   Severe hyperglycemia with blood glucose levels reaching 850 mg/dL required hospitalization. Symptoms include polyuria, polydipsia, and night sweats. There are issues with the current glucose monitoring device, Novavar, not connecting to his phone. Continue Novolog  and Lantus  as prescribed. Switch to [facility] for glucose monitoring, paying out of pocket if necessary.  Blurred vision   Blurred vision has persisted since Saturday, likely due to severe hyperglycemia. Vision remains blurry.    Marcelino Campos R Lowne Chase, DO

## 2024-06-20 NOTE — Progress Notes (Signed)
 Patient educated on insulin  and diabetes prior to discharge, left the hospital through front entrance with significant other

## 2024-06-20 NOTE — Plan of Care (Signed)

## 2024-06-20 NOTE — Inpatient Diabetes Management (Addendum)
 Inpatient Diabetes Program Recommendations  AACE/ADA: New Consensus Statement on Inpatient Glycemic Control (2015)  Target Ranges:  Prepandial:   less than 140 mg/dL      Peak postprandial:   less than 180 mg/dL (1-2 hours)      Critically ill patients:  140 - 180 mg/dL   Lab Results  Component Value Date   GLUCAP 266 (H) 06/20/2024   HGBA1C 9.3 (H) 06/19/2024    Review of Glycemic Control  Latest Reference Range & Units 06/19/24 07:45 06/19/24 11:23 06/19/24 15:52 06/19/24 21:40 06/20/24 07:25  Glucose-Capillary 70 - 99 mg/dL 671 (H) 635 (H) 739 (H) 249 (H) 266 (H)  (H): Data is abnormally high  Diabetes history: DM2 Outpatient Diabetes medications: None Current orders for Inpatient glycemic control: Lantus  15 units every day, Novolog  0-15 units TID and 0-5 units QHS  Inpatient Diabetes Program Recommendations:    Lantus  22 units every day Novolog  3 units TID with meals If he consumes at least 50%.  Addendum@10 :24:  Met with patient and significant other at bedside.  Educated patient on insulin  pen use at home. Reviewed contents of insulin  flexpen starter kit. Reviewed all steps of insulin  pen including attachment of needle, 2-unit air shot, dialing up dose, giving injection, removing needle, disposal of sharps, storage of unused insulin , disposal of insulin  etc. Patient able to provide successful return demonstration. Also reviewed troubleshooting with insulin  pen. MD to give patient Rxs for insulin  pens and insulin  pen needles.  Reviewed education from yesterday.  Reviewed the [facility] booklet.  Discussed hypoglycemia, signs, symptoms and treatments.  He has a PCP appointment this afternoon at 5:20 pm.  Placed Dexcom G7 on the back of his right arm.  Educated on use.    Will follow up with him today on DM and insulin  education.  Thank you, Wyvonna Pinal, MSN, CDCES Diabetes Coordinator Inpatient Diabetes Program [phone] (team pager from 8a-5p)

## 2024-06-20 NOTE — Progress Notes (Signed)
   06/20/24 1107  TOC Brief Assessment  Insurance and Status Reviewed  Patient has primary care physician Yes  Home environment has been reviewed resides in private residence  Prior level of function: Independent  Prior/Current Home Services No current home services  Social Drivers of Health Review SDOH reviewed no interventions necessary  Readmission risk has been reviewed Yes  Transition of care needs no transition of care needs at this time

## 2024-06-21 ENCOUNTER — Telehealth: Payer: Self-pay | Admitting: Neurology

## 2024-06-21 ENCOUNTER — Telehealth: Payer: Self-pay

## 2024-06-21 NOTE — Transitions of Care (Post Inpatient/ED Visit) (Signed)
 06/21/2024  Name: Thomas King MRN: [id number] DOB: [birth year]  Today's TOC FU Call Status: Today's TOC FU Call Status:: Successful TOC FU Call Completed TOC FU Call Complete Date: 06/21/24 Patient's Name and Date of Birth confirmed.  Transition Care Management Follow-up Telephone Call Date of Discharge: 06/20/24 Discharge Facility: [facility]) Type of Discharge: Inpatient Admission Primary Inpatient Discharge Diagnosis:: hyperglycemia How have you been since you were released from the hospital?: Better Any questions or concerns?: No  Items Reviewed: Did you receive and understand the discharge instructions provided?: Yes Medications obtained,verified, and reconciled?: Yes (Medications Reviewed) Any new allergies since your discharge?: No Dietary orders reviewed?: Yes Do you have support at home?: Yes People in Home [RPT]: significant other  Medications Reviewed Today: Medications Reviewed Today     Reviewed by Emmitt Pan, LPN (Licensed Practical Nurse) on 06/21/24 at 1435  Med List Status: <None>   Medication Order Taking? Sig Documenting Provider Last Dose Status Informant  amLODipine  (NORVASC ) 10 MG tablet [id number] Yes Take 1 tablet (10 mg total) by mouth daily. Antonio Cyndee Jamee JONELLE, DO  Active Self, Pharmacy Records  Blood Glucose Monitoring Suppl (BLOOD GLUCOSE MONITOR SYSTEM) w/Device KIT [id number] Yes Use to check blood sugar three times daily Krishnan, Gokul, MD  Active   Ciclopirox  1 % shampoo [id number] Yes Massage into scalp , wait 5 min then rinse, 2-3 x a week Antonio Cyndee, Jamee JONELLE, DO  Active Self, Pharmacy Records           Med Note CHICK LUKES M   Mon Apr 03, 2024  9:05 AM) Not used yet  Continuous Glucose Sensor (FREESTYLE LIBRE 3 PLUS SENSOR) [place] [id number] Yes Change sensor every 15 days. Antonio Cyndee, Jamee R, DO  Active   Glucose Blood (BLOOD GLUCOSE TEST STRIPS) STRP [id number] Yes Use to check blood sugar three times daily Krishnan,  Gokul, MD  Active   insulin  aspart (NOVOLOG ) 100 UNIT/ML FlexPen [id number] Yes Inject 5 Units into the skin 3 (three) times daily with meals. If eating and Blood Glucose (BG) 80 or higher inject 5 units for meal coverage and add correction dose per scale. If not eating, correction dose only. BG <150= 0 unit; BG 150-200= 1 unit; BG 201-250= 2 unit; BG 251-300= 3 unit; BG 301-350= 4 unit; BG 351-400= 5 unit; BG >400= 6 unit and Call Primary Care. Krishnan, Gokul, MD  Active   insulin  glargine (LANTUS ) 100 UNIT/ML Solostar Pen [id number] Yes Inject 22 Units into the skin daily. Verdene Purchase, MD  Active   Insulin  Pen Needle (PEN NEEDLES) 31G X 5 MM MISC [id number] Yes Use to inject insulin  three times daily Verdene Purchase, MD  Active   Lancet Device MISC [id number] Yes 1 each by Does not apply route 3 (three) times daily. May dispense any manufacturer covered by patient's insurance. Verdene Purchase, MD  Active   Lancets MISC [id number] Yes Use to check blood sugar three times daily Krishnan, Gokul, MD  Active   naproxen  (NAPROSYN ) 500 MG tablet [id number] Yes Take 1 tablet (500 mg total) by mouth 2 (two) times daily as needed (fever or body aches). Arloa Suzen RAMAN, NP  Active Self, Pharmacy Records  potassium chloride  SA (KLOR-CON  M) 20 MEQ tablet [id number] Yes Take 1 tablet (20 mEq total) by mouth daily for 5 days. Krishnan, Gokul, MD  Active   tirzepatide  (MOUNJARO ) 2.5 MG/0.5ML Pen [id number] Yes Inject 2.5 mg into the skin once a week. Lowne Chase, Yvonne R, DO  Active             Home Care and Equipment/Supplies: Were Home Health Services Ordered?: NA Any new equipment or medical supplies ordered?: NA  Functional Questionnaire: Do you need assistance with bathing/showering or dressing?: No Do you need assistance with meal preparation?: No Do you need assistance with eating?: No Do you have difficulty maintaining continence: No Do you need assistance with getting out of bed/getting out of a  chair/moving?: No Do you have difficulty managing or taking your medications?: No  Follow up appointments reviewed: PCP Follow-up appointment confirmed?: Yes Date of PCP follow-up appointment?: 06/20/24 Follow-up Provider: hyperglycemia Specialist Hospital Follow-up appointment confirmed?: NA Do you need transportation to your follow-up appointment?: No Do you understand care options if your condition(s) worsen?: Yes-patient verbalized understanding    SIGNATURE Julian Lemmings, LPN [facility] Nurse Health Advisor Direct Dial [phone]

## 2024-06-21 NOTE — Telephone Encounter (Signed)
 I spoke with the patient.  [employer] no auth req spoke to Rosina DEL ref # [id number]   He is scheduled at [facility] for 07/16/24 at 8 pm.  Mailed packet and sent mychart.

## 2024-06-23 NOTE — Assessment & Plan Note (Signed)
 Poorly controlled will alter medications, encouraged DASH diet, minimize caffeine and obtain adequate sleep. Report concerning symptoms and follow up as directed and as needed

## 2024-06-23 NOTE — Assessment & Plan Note (Signed)
 Encourage heart healthy diet such as MIND or DASH diet, increase exercise, avoid trans fats, simple carbohydrates and processed foods, consider a krill or fish or flaxseed oil cap daily.

## 2024-06-24 NOTE — Assessment & Plan Note (Signed)
 hgba1c to be checked, minimize simple carbs. Increase exercise as tolerated. Continue current meds

## 2024-07-06 ENCOUNTER — Encounter: Attending: Family Medicine | Admitting: Dietician

## 2024-07-06 ENCOUNTER — Encounter: Payer: Self-pay | Admitting: Dietician

## 2024-07-06 VITALS — Ht 69.0 in | Wt 238.0 lb

## 2024-07-06 DIAGNOSIS — E119 Type 2 diabetes mellitus without complications: Secondary | ICD-10-CM | POA: Insufficient documentation

## 2024-07-06 NOTE — Patient Instructions (Signed)
 Great job on changes made!  Plan:  Aim for 4 Carb Choices per meal (60 grams) +/- 1 either way  Aim for 0-1 Carbs per snack if hungry  Include protein in moderation with your meals and snacks Consider reading food labels for Total Carbohydrate of foods Consider  increasing your activity level by walking and weights for 60 minutes daily as tolerated Consider checking BG at alternate times per day  Consider taking medication as directed by MD

## 2024-07-06 NOTE — Progress Notes (Signed)
 Diabetes Self-Management Education  Visit Type: First/Initial  Appt. Start Time: 0925 Appt. End Time: 1045  07/06/2024  [name], identified by name and date of birth, is a 43 y.o. male with a diagnosis of Diabetes: Type 2.   ASSESSMENT Patient is here today alone.  History includes:  Type 2 Diabetes (2025), HTN, HLD Medications include:  Lantus  22 units once per day, Novolog  5 food dose plus sliding scale 1:50 for blood glucose >150, Mounjaro  (starting tonight) Labs noted to include:  A1C 9.3% 06/19/2024, eGFR 72 on 06/20/2024 Lipid Panel     Component Value Date/Time   CHOL 164 03/17/2024 1121   TRIG 234 (H) 03/17/2024 1121   HDL 29 (L) 03/17/2024 1121   CHOLHDL 5.7 (H) 03/17/2024 1121   VLDL 62.6 (H) 05/07/2022 1445   LDLCALC 100 (H) 03/17/2024 1121   LDLDIRECT 89.0 05/07/2022 1445   CGM:  Libre 3+  CGM Results from download: 07/06/2024  % Time CGM active:   96 %   (Goal >70%)  Average glucose:   118 mg/dL for 14 days  Glucose management indicator:   6.1 %  Time in range (70-180 mg/dL):    03%   (Goal >29%)  Time High (181-250 mg/dL):   2 %   (Goal < 74%)  Time Very High (>250 mg/dL):    1 %   (Goal < 5%)  Time Low (54-69 mg/dL):   1%   (Goal <5%)  Time Very Low (<54 mg/dL):   0 %   (Goal <8%)  %CV (glucose variability)     %  (Goal <36%)    68 238 lbs 07/06/2024  Patient lives with his girlfriend.  She is in nursing school.  Her mother does the cooking. Patient work at [employer] noon-4:30.  Very physical job. Goes to the gym daily. Allergic to fish, shellfish.  Height 5' 9 (1.753 m), weight 238 lb (108 kg). Body mass index is 35.15 kg/m.   Diabetes Self-Management Education - 07/06/24 0957       Visit Information   Visit Type First/Initial      Initial Visit   Diabetes Type Type 2    Date Diagnosed 06/2024    Are you currently following a meal plan? Yes    What type of meal plan do you follow? diabetes diet    Are you taking your medications as  prescribed? Yes      Health Coping   How would you rate your overall health? Good      Psychosocial Assessment   Patient Belief/Attitude about Diabetes Afraid    What is the hardest part about your diabetes right now, causing you the most concern, or is the most worrisome to you about your diabetes?   Checking blood sugar    Self-care barriers None    Self-management support Doctor's office;CDE visits    Other persons present Patient    Patient Concerns Nutrition/Meal planning;Glycemic Control;Problem Solving;Support    Special Needs None    Preferred Learning Style No preference indicated    Learning Readiness Ready    How often do you need to have someone help you when you read instructions, pamphlets, or other written materials from your doctor or pharmacy? 1 - Never    What is the last grade level you completed in school? 12      Pre-Education Assessment   Patient understands the diabetes disease and treatment process. Needs Instruction    Patient understands incorporating nutritional management into lifestyle.  Needs Instruction    Patient undertands incorporating physical activity into lifestyle. Needs Instruction    Patient understands using medications safely. Needs Instruction    Patient understands monitoring blood glucose, interpreting and using results Needs Instruction    Patient understands prevention, detection, and treatment of acute complications. Needs Instruction    Patient understands prevention, detection, and treatment of chronic complications. Needs Instruction    Patient understands how to develop strategies to address psychosocial issues. Needs Instruction    Patient understands how to develop strategies to promote health/change behavior. Needs Instruction      Complications   Last HgB A1C per patient/outside source 9.3 %    How often do you check your blood sugar? > 4 times/day    Fasting Blood glucose range (mg/dL) 29-870    Postprandial Blood glucose range  (mg/dL) 869-820    Number of hypoglycemic episodes per month 1    Can you tell when your blood sugar is low? Yes    What do you do if your blood sugar is low? glucose tabs    Have you had a dilated eye exam in the past 12 months? No    Have you had a dental exam in the past 12 months? No    Are you checking your feet? Yes    How many days per week are you checking your feet? 7      Dietary Intake   Breakfast PB & sandwich (uncrustable) but usually boiled eggs and fruit    Lunch grilled chicken, steak, eggs    Snack (afternoon) uncrustable PB&J sandwich    Dinner salad, grilled chicken, balsamic dressing    Snack (evening) unsalted peanuts    Beverage(s) water, sugar free flavor packs      Activity / Exercise   Activity / Exercise Type Moderate (swimming / aerobic walking)    How many days per week do you exercise? 7    How many minutes per day do you exercise? 60    Total minutes per week of exercise 420      Patient Education   Previous Diabetes Education Yes   in the hospital   Disease Pathophysiology Definition of diabetes, type 1 and 2, and the diagnosis of diabetes;Explored patient's options for treatment of their diabetes    Healthy Eating Role of diet in the treatment of diabetes and the relationship between the three main macronutrients and blood glucose level;Food label reading, portion sizes and measuring food.;Plate Method;Carbohydrate counting;Meal options for control of blood glucose level and chronic complications.;Reviewed blood glucose goals for pre and post meals and how to evaluate the patients' food intake on their blood glucose level.    Being Active Role of exercise on diabetes management, blood pressure control and cardiac health.    Medications Taught/reviewed insulin /injectables, injection, site rotation, insulin /injectables storage and needle disposal.    Monitoring Identified appropriate SMBG and/or A1C goals.;Yearly dilated eye exam;Taught/evaluated CGM  (comment);Daily foot exams    Chronic complications Relationship between chronic complications and blood glucose control    Diabetes Stress and Support Role of stress on diabetes;Worked with patient to identify barriers to care and solutions      Individualized Goals (developed by patient)   Nutrition General guidelines for healthy choices and portions discussed    Physical Activity Exercise 5-7 days per week    Medications take my medication as prescribed    Monitoring  Consistenly use CGM    Problem Solving Eating Pattern;Addressing barriers to behavior change  Reducing Risk examine blood glucose patterns;treat hypoglycemia with 15 grams of carbs if blood glucose less than 70mg /dL;do foot checks daily      Post-Education Assessment   Patient understands the diabetes disease and treatment process. Comprehends key points    Patient understands incorporating nutritional management into lifestyle. Needs Review    Patient undertands incorporating physical activity into lifestyle. Comprehends key points    Patient understands using medications safely. Needs Review    Patient understands monitoring blood glucose, interpreting and using results Comprehends key points    Patient understands prevention, detection, and treatment of acute complications. Comprehends key points    Patient understands prevention, detection, and treatment of chronic complications. Comprehends key points    Patient understands how to develop strategies to address psychosocial issues. Comprehends key points    Patient understands how to develop strategies to promote health/change behavior. Needs Review      Outcomes   Future DMSE 2 months    Program Status Not Completed          Individualized Plan for Diabetes Self-Management Training:   Learning Objective:  Patient will have a greater understanding of diabetes self-management. Patient education plan is to attend individual and/or group sessions per assessed  needs and concerns.   Plan:   Patient Instructions  [place] job on changes made!  Plan:  Aim for 4 Carb Choices per meal (60 grams) +/- 1 either way  Aim for 0-1 Carbs per snack if hungry  Include protein in moderation with your meals and snacks Consider reading food labels for Total Carbohydrate of foods Consider  increasing your activity level by walking and weights for 60 minutes daily as tolerated Consider checking BG at alternate times per day  Consider taking medication as directed by MD    Expected Outcomes:     Education material provided: ADA - How to Thrive: A Guide for Your Journey with Diabetes, Food label handouts, Meal plan card, Snack sheet, and Diabetes Resources  If problems or questions, patient to contact team via:  Phone  Future DSME appointment: 2 months

## 2024-07-16 ENCOUNTER — Ambulatory Visit (INDEPENDENT_AMBULATORY_CARE_PROVIDER_SITE_OTHER): Admitting: Neurology

## 2024-07-16 DIAGNOSIS — R351 Nocturia: Secondary | ICD-10-CM

## 2024-07-16 DIAGNOSIS — R0683 Snoring: Secondary | ICD-10-CM

## 2024-07-16 DIAGNOSIS — G4719 Other hypersomnia: Secondary | ICD-10-CM

## 2024-07-16 DIAGNOSIS — R0681 Apnea, not elsewhere classified: Secondary | ICD-10-CM

## 2024-07-16 DIAGNOSIS — G4720 Circadian rhythm sleep disorder, unspecified type: Secondary | ICD-10-CM

## 2024-07-16 DIAGNOSIS — G4733 Obstructive sleep apnea (adult) (pediatric): Secondary | ICD-10-CM

## 2024-07-16 DIAGNOSIS — E669 Obesity, unspecified: Secondary | ICD-10-CM

## 2024-07-16 DIAGNOSIS — Z9189 Other specified personal risk factors, not elsewhere classified: Secondary | ICD-10-CM

## 2024-07-16 DIAGNOSIS — R519 Headache, unspecified: Secondary | ICD-10-CM

## 2024-07-24 ENCOUNTER — Ambulatory Visit: Payer: Self-pay | Admitting: Neurology

## 2024-07-24 DIAGNOSIS — G4733 Obstructive sleep apnea (adult) (pediatric): Secondary | ICD-10-CM

## 2024-07-24 NOTE — Procedures (Signed)
 Physician Interpretation: Please see link under Procedure Tab or under Encounters tab for physician report, technical report, as well as O2 titration and/or PAP titration tables (if applicable).

## 2024-08-03 ENCOUNTER — Other Ambulatory Visit: Payer: Self-pay | Admitting: Family Medicine

## 2024-08-03 DIAGNOSIS — E1110 Type 2 diabetes mellitus with ketoacidosis without coma: Secondary | ICD-10-CM

## 2024-08-03 NOTE — Telephone Encounter (Signed)
 Thomas King D, CMA  [name], [name], [name]; Darrel Boyer New orders have been placed for the above pt, DOB:[birth year] Thanks

## 2024-08-13 ENCOUNTER — Encounter: Payer: Self-pay | Admitting: Family Medicine

## 2024-08-14 MED ORDER — TIRZEPATIDE 5 MG/0.5ML ~~LOC~~ SOAJ: 5.0000 mg | SUBCUTANEOUS | 0 refills | Status: DC

## 2024-08-25 ENCOUNTER — Encounter: Payer: Self-pay | Admitting: Family Medicine

## 2024-08-25 ENCOUNTER — Ambulatory Visit: Admitting: Family Medicine

## 2024-08-25 VITALS — BP 100/68 | HR 77 | Temp 98.3°F | Resp 18 | Ht 69.0 in | Wt 228.8 lb

## 2024-08-25 DIAGNOSIS — E785 Hyperlipidemia, unspecified: Secondary | ICD-10-CM

## 2024-08-25 DIAGNOSIS — Z7985 Long-term (current) use of injectable non-insulin antidiabetic drugs: Secondary | ICD-10-CM

## 2024-08-25 DIAGNOSIS — L219 Seborrheic dermatitis, unspecified: Secondary | ICD-10-CM

## 2024-08-25 DIAGNOSIS — E1110 Type 2 diabetes mellitus with ketoacidosis without coma: Secondary | ICD-10-CM | POA: Diagnosis not present

## 2024-08-25 DIAGNOSIS — I10 Essential (primary) hypertension: Secondary | ICD-10-CM | POA: Diagnosis not present

## 2024-08-25 DIAGNOSIS — G4733 Obstructive sleep apnea (adult) (pediatric): Secondary | ICD-10-CM | POA: Insufficient documentation

## 2024-08-25 LAB — CBC WITH DIFFERENTIAL/PLATELET
Basophils Absolute: 0 K/uL (ref 0.0–0.1)
Basophils Relative: 0.7 % (ref 0.0–3.0)
Eosinophils Absolute: 0.5 K/uL (ref 0.0–0.7)
Eosinophils Relative: 8.3 % — ABNORMAL HIGH (ref 0.0–5.0)
HCT: 45.5 % (ref 39.0–52.0)
Hemoglobin: 14.7 g/dL (ref 13.0–17.0)
Lymphocytes Relative: 25.7 % (ref 12.0–46.0)
Lymphs Abs: 1.7 K/uL (ref 0.7–4.0)
MCHC: 32.3 g/dL (ref 30.0–36.0)
MCV: 88.1 fl (ref 78.0–100.0)
Monocytes Absolute: 0.4 K/uL (ref 0.1–1.0)
Monocytes Relative: 5.5 % (ref 3.0–12.0)
Neutro Abs: 3.9 K/uL (ref 1.4–7.7)
Neutrophils Relative %: 59.8 % (ref 43.0–77.0)
Platelets: 260 K/uL (ref 150.0–400.0)
RBC: 5.17 Mil/uL (ref 4.22–5.81)
RDW: 13.1 % (ref 11.5–15.5)
WBC: 6.5 K/uL (ref 4.0–10.5)

## 2024-08-25 LAB — COMPREHENSIVE METABOLIC PANEL WITH GFR
ALT: 25 U/L (ref 0–53)
AST: 22 U/L (ref 0–37)
Albumin: 4.3 g/dL (ref 3.5–5.2)
Alkaline Phosphatase: 98 U/L (ref 39–117)
BUN: 13 mg/dL (ref 6–23)
CO2: 27 meq/L (ref 19–32)
Calcium: 9.3 mg/dL (ref 8.4–10.5)
Chloride: 105 meq/L (ref 96–112)
Creatinine, Ser: 1.21 mg/dL (ref 0.40–1.50)
GFR: 73.31 mL/min (ref >60.00)
Glucose, Bld: 131 mg/dL — ABNORMAL HIGH (ref 70–99)
Potassium: 4.1 meq/L (ref 3.5–5.1)
Sodium: 140 meq/L (ref 135–145)
Total Bilirubin: 0.5 mg/dL (ref 0.2–1.2)
Total Protein: 7.3 g/dL (ref 6.0–8.3)

## 2024-08-25 LAB — HEMOGLOBIN A1C: Hgb A1c MFr Bld: 5.2 % (ref 4.6–6.5)

## 2024-08-25 LAB — TSH: TSH: 1.02 u[IU]/mL (ref 0.35–5.50)

## 2024-08-25 LAB — LIPID PANEL
Cholesterol: 150 mg/dL (ref 0–200)
HDL: 23.8 mg/dL — ABNORMAL LOW (ref >39.00)
LDL Cholesterol: 83 mg/dL (ref 0–99)
NonHDL: 126
Total CHOL/HDL Ratio: 6
Triglycerides: 214 mg/dL — ABNORMAL HIGH (ref 0.0–149.0)
VLDL: 42.8 mg/dL — ABNORMAL HIGH (ref 0.0–40.0)

## 2024-08-25 LAB — MICROALBUMIN / CREATININE URINE RATIO
Creatinine,U: 145.2 mg/dL
Microalb Creat Ratio: UNDETERMINED mg/g (ref 0.0–30.0)
Microalb, Ur: <0.7 mg/dL

## 2024-08-25 MED ORDER — CICLOPIROX 1 % EX SHAM: MEDICATED_SHAMPOO | CUTANEOUS | 0 refills | Status: AC

## 2024-08-25 NOTE — Progress Notes (Signed)
 Subjective:    Patient ID: Thomas King, male    DOB: [birth year], 43 y.o.   MRN: [id number]  Chief Complaint  Patient presents with   Hypertension   Diabetes   Follow-up    HPI Patient is in today for f/u bp.  Discussed the use of AI scribe software for clinical note transcription with the patient, who gave verbal consent to proceed.  History of Present Illness Thomas King is a 43 year old male with diabetes who presents for follow-up on Mounjaro  treatment and insulin  management.  He is currently on Mounjaro , having recently started a new dose of 5 mg. After the initial dose of 2.5 mg, he lost a few pounds. He took his first dose of the new strength last week and the second dose this week. He has not experienced any adverse effects such as nausea or constipation.  He is also on insulin  therapy, specifically Novolog , at a dose of 5 units three times a day. However, he does not always need to take it three times daily as his blood sugar levels have been lower. He also takes Lantus , a long-acting insulin , in the morning. His blood sugar levels were normal before starting Mounjaro  and sometimes drop unexpectedly. He attributes a recent rise in blood sugar to eating a bowl of cereal before the visit.  He has issues with sleep apnea and recently picked up a machine on November 14th. However, he returned it due to discomfort, including headaches and chest pressure, and concerns about insurance billing if not used for four hours daily. He has a similar machine at home and plans to try different masks.  He experiences stiffness and swelling in his feet upon waking, for which he takes naproxen .  He works in a naval architect for [employer], especially busy during the peak season, working Sunday through Friday. He plans to work on Friday after Thanksgiving, which is paid at time and a half.    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  Tobacco Use   Smoking status: Never   Smokeless tobacco: Never  Vaping Use   Vaping status: Never Used  Substance and Sexual Activity   Alcohol use: Not Currently   Drug use: No   Sexual activity: Yes    Partners: Female    Birth control/protection: Condom  Other Topics Concern   Not on file  Social History Narrative   Exercise-- occassional   Pt lives with family    Pt works    Social Drivers of Corporate Investment Banker Strain: Not on file  Food Insecurity: No Food Insecurity (06/18/2024)   Hunger Vital Sign    Worried About Running Out of Food in the Last Year: Never true  Ran Out of Food in the Last Year: Never true  Transportation Needs: No Transportation Needs (06/18/2024)   PRAPARE - Administrator, Civil Service (Medical): No    Lack of Transportation (Non-Medical): No  Physical Activity: Not on file  Stress: Not on file  Social Connections: Not on file  Intimate Partner Violence: Not At Risk (06/18/2024)   Humiliation, Afraid, Rape, and Kick questionnaire    Fear of Current or Ex-Partner: No    Emotionally Abused: No    Physically Abused: No    Sexually Abused: No    Outpatient Medications Prior to Visit  Medication Sig Dispense Refill   amLODipine  (NORVASC ) 10 MG tablet Take 1 tablet (10 mg total) by mouth daily. 90 tablet 1   Blood Glucose Monitoring Suppl (BLOOD GLUCOSE MONITOR SYSTEM) w/Device KIT Use to check blood  sugar three times daily 1 kit 0   Continuous Glucose Sensor (FREESTYLE LIBRE 3 PLUS SENSOR) MISC Change sensor every 15 days. 2 each 3   Glucose Blood (BLOOD GLUCOSE TEST STRIPS) STRP Use to check blood sugar three times daily 100 strip 0   insulin  aspart (NOVOLOG ) 100 UNIT/ML FlexPen Inject 5 Units into the skin 3 (three) times daily with meals. If eating and Blood Glucose (BG) 80 or higher inject 5 units for meal coverage and add correction dose per scale. If not eating, correction dose only. BG <150= 0 unit; BG 150-200= 1 unit; BG 201-250= 2 unit; BG 251-300= 3 unit; BG 301-350= 4 unit; BG 351-400= 5 unit; BG >400= 6 unit and Call Primary Care. 15 mL 0   insulin  glargine (LANTUS ) 100 UNIT/ML Solostar Pen Inject 22 Units into the skin daily. 15 mL 2   Insulin  Pen Needle (PEN NEEDLES) 31G X 5 MM MISC Use to inject insulin  three times daily 100 each 0   Lancet Device MISC 1 each by Does not apply route 3 (three) times daily. May dispense any manufacturer covered by patient's insurance. 1 each 0   Lancets MISC Use to check blood sugar three times daily 100 each 0   naproxen  (NAPROSYN ) 500 MG tablet Take 1 tablet (500 mg total) by mouth 2 (two) times daily as needed (fever or body aches). 30 tablet 0   tirzepatide  (MOUNJARO ) 5 MG/0.5ML Pen Inject 5 mg into the skin once a week. 2 mL 0   Ciclopirox  1 % shampoo Massage into scalp , wait 5 min then rinse, 2-3 x a week 120 mL 0   potassium chloride  SA (KLOR-CON  M) 20 MEQ tablet Take 1 tablet (20 mEq total) by mouth daily for 5 days. (Patient not taking: Reported on 08/25/2024) 5 tablet 0   No facility-administered medications prior to visit.    Allergies  Allergen Reactions   Other Shortness Of Breath and Rash    ALL SEAFOOD   Shellfish Allergy Rash, Shortness Of Breath and Hives    Review of Systems  Constitutional:  Negative for fever and malaise/fatigue.  HENT:  Negative for congestion.   Eyes:  Negative for blurred vision.  Respiratory:   Negative for shortness of breath.   Cardiovascular:  Negative for chest pain, palpitations and leg swelling.  Gastrointestinal:  Negative for abdominal pain, blood in stool and nausea.  Genitourinary:  Negative for dysuria and frequency.  Musculoskeletal:  Negative for falls.  Skin:  Negative for rash.  Neurological:  Negative for dizziness, loss of consciousness and headaches.  Endo/Heme/Allergies:  Negative for environmental allergies.  Psychiatric/Behavioral:  Negative for  depression. The patient is not nervous/anxious.        Objective:    Physical Exam Vitals and nursing note reviewed.  Constitutional:      General: He is not in acute distress.    Appearance: Normal appearance. He is well-developed.  HENT:     Head: Normocephalic and atraumatic.  Eyes:     General: No scleral icterus.       Right eye: No discharge.        Left eye: No discharge.  Cardiovascular:     Rate and Rhythm: Normal rate and regular rhythm.     Heart sounds: No murmur heard. Pulmonary:     Effort: Pulmonary effort is normal. No respiratory distress.     Breath sounds: Normal breath sounds.  Musculoskeletal:        General: Normal range of motion.     Cervical back: Normal range of motion and neck supple.     Right lower leg: No edema.     Left lower leg: No edema.  Skin:    General: Skin is warm and dry.  Neurological:     Mental Status: He is alert and oriented to person, place, and time.  Psychiatric:        Mood and Affect: Mood normal.        Behavior: Behavior normal.        Thought Content: Thought content normal.        Judgment: Judgment normal.     BP 100/68 (BP Location: Right Arm, Patient Position: Sitting)   Pulse 77   Temp 98.3 F (36.8 C) (Oral)   Resp 18   Ht 5' 9 (1.753 m)   Wt 228 lb 12.8 oz (103.8 kg)   SpO2 96%   BMI 33.79 kg/m  Wt Readings from Last 3 Encounters:  08/25/24 228 lb 12.8 oz (103.8 kg)  07/06/24 238 lb (108 kg)  06/20/24 237 lb (107.5 kg)     Diabetic Foot Exam - Simple   Simple Foot Form Diabetic Foot exam was performed with the following findings: Yes 08/25/2024  9:47 AM  Visual Inspection No deformities, no ulcerations, no other skin breakdown bilaterally: Yes Sensation Testing Intact to touch and monofilament testing bilaterally: Yes Pulse Check Comments    Lab Results  Component Value Date   WBC 9.8 06/19/2024   HGB 14.4 06/19/2024   HCT 44.0 06/19/2024   PLT 250 06/19/2024   GLUCOSE 210 (H) 06/20/2024   CHOL 164 03/17/2024   TRIG 234 (H) 03/17/2024   HDL 29 (L) 03/17/2024   LDLDIRECT 89.0 05/07/2022   LDLCALC 100 (H) 03/17/2024   ALT 37 06/18/2024   AST 30 06/18/2024   NA 138 06/20/2024   K 3.3 (L) 06/20/2024   CL 101 06/20/2024   CREATININE 1.01 06/20/2024   BUN 7 06/20/2024   CO2 22 06/20/2024   TSH 1.18 03/17/2024   PSA 0.52 03/17/2024   HGBA1C 9.3 (H) 06/19/2024    Lab Results  Component Value Date   TSH 1.18 03/17/2024   Lab Results  Component Value Date   WBC 9.8 06/19/2024   HGB 14.4 06/19/2024   HCT 44.0 06/19/2024   MCV 87.8 06/19/2024   PLT 250 06/19/2024   Lab Results  Component Value Date   NA 138 06/20/2024   K 3.3 (L) 06/20/2024   CO2 22 06/20/2024   GLUCOSE 210 (H) 06/20/2024   BUN 7 06/20/2024   CREATININE 1.01 06/20/2024   BILITOT 0.6 06/18/2024  ALKPHOS 192 (H) 06/18/2024   AST 30 06/18/2024   ALT 37 06/18/2024   PROT 8.7 (H) 06/18/2024   ALBUMIN 4.7 06/18/2024   CALCIUM 8.9 06/20/2024   ANIONGAP 14 06/20/2024   EGFR 72 03/17/2024   GFR 73.05 05/07/2022   Lab Results  Component Value Date   CHOL 164 03/17/2024   Lab Results  Component Value Date   HDL 29 (L) 03/17/2024   Lab Results  Component Value Date   LDLCALC 100 (H) 03/17/2024   Lab Results  Component Value Date   TRIG 234 (H) 03/17/2024   Lab Results  Component Value Date   CHOLHDL 5.7 (H) 03/17/2024   Lab Results  Component Value Date   HGBA1C 9.3 (H) 06/19/2024        Assessment & Plan:  Diabetic ketoacidosis without coma associated with type 2 diabetes mellitus (HCC) -     Hemoglobin A1c -     Microalbumin / creatinine urine ratio  Seborrhea -     Ciclopirox ; Massage into scalp , wait 5 min then rinse, 2-3 x a week  Dispense: 120 mL; Refill: 0 -     Ambulatory referral to Dermatology  Essential hypertension Assessment & Plan: Well controlled, no changes to meds. Encouraged heart healthy diet such as the DASH diet and exercise as tolerated.    Orders: -     CBC with Differential/Platelet -     Comprehensive metabolic panel with GFR -     Lipid panel -     TSH  Hyperlipidemia, unspecified hyperlipidemia type Assessment & Plan: Encourage heart healthy diet such as MIND or DASH diet, increase exercise, avoid trans fats, simple carbohydrates and processed foods, consider a krill or fish or flaxseed oil cap daily.    Orders: -     Comprehensive metabolic panel with GFR -     Lipid panel  Morbid obesity (HCC)  OSA (obstructive sleep apnea)  Assessment and Plan Assessment & Plan Type 2 diabetes mellitus   Type 2 diabetes mellitus is managed with Mounjaro  and insulin  therapy. He is currently on Novolog  5 units three times a day but reports lower blood sugar levels and reduced need for short-acting insulin . Mounjaro  has contributed to weight loss and improved glycemic control. His A1c was previously 9, with anticipated improvement on the current regimen. Continue Mounjaro  therapy and monitor for side effects such as constipation. He is advised to take Senokot or Dulcolax if constipation occurs and to drink plenty of water. Continue Novolog  as needed, with potential reduction in short-acting insulin  if blood sugars remain stable. He is encouraged to follow up with an eye doctor for diabetic retinopathy screening.  Morbid obesity   He has a BMI of 33 and has recently lost 11 pounds over two months, attributed to Mounjaro  therapy. The goal weight is  closer to 200 pounds, with an emphasis on gradual weight loss to avoid discouragement. Continue Mounjaro  therapy to aid in weight loss. He is encouraged to pursue gradual weight loss with small increments to maintain motivation.  Obstructive sleep apnea   He reports recent issues with the CPAP machine causing headaches and discomfort, with difficulty in mask fit and pressure settings leading to discontinuation of CPAP use. He is advised to contact the CPAP provider to discuss mask fit and pressure settings and is encouraged to trial different masks to improve comfort and adherence.  Foot pain and swelling   He experiences intermittent foot pain and swelling, particularly upon waking, which is  currently managed with naproxen  providing relief. Continue naproxen  for foot pain and swelling as needed.    Vin Yonke R Lowne Chase, DO

## 2024-08-25 NOTE — Assessment & Plan Note (Signed)
 Well controlled, no changes to meds. Encouraged heart healthy diet such as the DASH diet and exercise as tolerated.

## 2024-08-25 NOTE — Assessment & Plan Note (Signed)
 Encourage heart healthy diet such as MIND or DASH diet, increase exercise, avoid trans fats, simple carbohydrates and processed foods, consider a krill or fish or flaxseed oil cap daily.

## 2024-08-28 ENCOUNTER — Other Ambulatory Visit: Payer: Self-pay | Admitting: Family Medicine

## 2024-08-28 ENCOUNTER — Ambulatory Visit: Payer: Self-pay | Admitting: Family Medicine

## 2024-08-28 DIAGNOSIS — E785 Hyperlipidemia, unspecified: Secondary | ICD-10-CM

## 2024-08-28 DIAGNOSIS — I10 Essential (primary) hypertension: Secondary | ICD-10-CM

## 2024-08-28 DIAGNOSIS — E1165 Type 2 diabetes mellitus with hyperglycemia: Secondary | ICD-10-CM

## 2024-09-08 ENCOUNTER — Encounter: Admitting: Dietician

## 2024-09-08 ENCOUNTER — Encounter: Payer: Self-pay | Admitting: Dietician

## 2024-09-08 VITALS — Wt 228.0 lb

## 2024-09-08 DIAGNOSIS — E119 Type 2 diabetes mellitus without complications: Secondary | ICD-10-CM | POA: Diagnosis present

## 2024-09-08 NOTE — Progress Notes (Signed)
 Diabetes Self-Management Education  Visit Type: Follow-up  Appt. Start Time: 0950 Appt. End Time: 1020  09/08/2024  [name], identified by name and date of birth, is a 43 y.o. male with a diagnosis of Diabetes:  .   ASSESSMENT Patient is here today alone.  This is a virtual appointment.  Patient is at his work in the break room and I am at my office.  He was last seen by myself on 07/06/2024. He reports that he is doing well but has been tired.  Working extra shifts.  Noted that he did not tolerate the C-Pap and returned this.  Not eating enough by 24 hour recall to sustain his energy as well.  Had been eating more balanced meals at [employer] but they are closed. He is not wearing the Herlene currently and plans to pick it up shortly.   History includes:  Type 2 Diabetes (2025), HTN, HLD Medications include:  Lantus  22 units once per day, Novolog  5 food dose plus sliding scale 1:50 for blood glucose >150 (not taking as his glucose is <150), Mounjaro  (starting tonight) Labs noted to include:  A1C 5.2% 08/25/2024, 9.3% 06/19/2024, eGFR 72 on 06/20/2024 Lipid Panel          Component Value Date/Time    CHOL 164 03/17/2024 1121    TRIG 234 (H) 03/17/2024 1121    HDL 29 (L) 03/17/2024 1121    CHOLHDL 5.7 (H) 03/17/2024 1121    VLDL 62.6 (H) 05/07/2022 1445    LDLCALC 100 (H) 03/17/2024 1121    LDLDIRECT 89.0 05/07/2022 1445    CGM:  Libre 3+ - currently not wearing   CGM Results from download: 07/06/2024  % Time CGM active:   96 %   (Goal >70%)  Average glucose:   118 mg/dL for 14 days  Glucose management indicator:   6.1 %  Time in range (70-180 mg/dL):    03%   (Goal >29%)  Time High (181-250 mg/dL):   2 %   (Goal < 74%)  Time Very High (>250 mg/dL):    1 %   (Goal < 5%)  Time Low (54-69 mg/dL):   1%   (Goal <5%)  Time Very Low (<54 mg/dL):   0 %   (Goal <8%)  %CV (glucose variability)     %  (Goal <36%)      68 228 lbs 08/25/2024 238 lbs 07/06/2024   Patient lives with his  girlfriend.  She is in nursing school.  Her mother does the cooking. Patient work at [employer] noon-4:30.  Very physical job. Goes to the gym daily. Allergic to fish, shellfish.  Weight 228 lb (103.4 kg). Body mass index is 33.67 kg/m.   Diabetes Self-Management Education - 09/08/24 1030       Visit Information   Visit Type Follow-up      Psychosocial Assessment   Patient Belief/Attitude about Diabetes Motivated to manage diabetes    What is the hardest part about your diabetes right now, causing you the most concern, or is the most worrisome to you about your diabetes?   Making healty food and beverage choices    Self-care barriers None    Self-management support Doctor's office;CDE visits    Other persons present Patient    Patient Concerns Nutrition/Meal planning;Healthy Lifestyle;Glycemic Control;Weight Control    Special Needs None    Preferred Learning Style No preference indicated    Learning Readiness Ready    How often do you need to  have someone help you when you read instructions, pamphlets, or other written materials from your doctor or pharmacy? 1 - Never      Pre-Education Assessment   Patient understands the diabetes disease and treatment process. Needs Review    Patient understands incorporating nutritional management into lifestyle. Needs Review    Patient undertands incorporating physical activity into lifestyle. Needs Review    Patient understands using medications safely. Needs Review    Patient understands monitoring blood glucose, interpreting and using results Needs Review    Patient understands prevention, detection, and treatment of acute complications. Needs Review    Patient understands prevention, detection, and treatment of chronic complications. Needs Review    Patient understands how to develop strategies to address psychosocial issues. Needs Review    Patient understands how to develop strategies to promote health/change behavior. Needs Review       Complications   Last HgB A1C per patient/outside source 5.2 %   08/25/2024   How often do you check your blood sugar? > 4 times/day   when wearing a sensor     Dietary Intake   Breakfast eggs, 1/2 strip bacon, 1 slice dry [employer] toast    Lunch baked chicken, broccoli    Snack (afternoon) peanuts    Dinner too tired to eat    Beverage(s) water, zero sugar packets      Activity / Exercise   Activity / Exercise Type Moderate (swimming / aerobic walking)    How many days per week do you exercise? 5    How many minutes per day do you exercise? 60    Total minutes per week of exercise 300      Patient Education   Previous Diabetes Education Yes   08/2024   Disease Pathophysiology Explored patient's options for treatment of their diabetes    Healthy Eating Meal options for control of blood glucose level and chronic complications.;Information on hints to eating out and maintain blood glucose control.    Being Active Role of exercise on diabetes management, blood pressure control and cardiac health.    Medications Reviewed patients medication for diabetes, action, purpose, timing of dose and side effects.    Monitoring Identified appropriate SMBG and/or A1C goals.    Diabetes Stress and Support Identified and addressed patients feelings and concerns about diabetes;Worked with patient to identify barriers to care and solutions      Individualized Goals (developed by patient)   Nutrition General guidelines for healthy choices and portions discussed    Physical Activity Exercise 5-7 days per week;60 minutes per day    Medications take my medication as prescribed    Monitoring  Consistenly use CGM   alarm for lows   Problem Solving Eating Pattern    Reducing Risk examine blood glucose patterns;do foot checks daily;treat hypoglycemia with 15 grams of carbs if blood glucose less than 70mg /dL      Patient Self-Evaluation of Goals - Patient rates self as meeting previously set goals (% of time)    Nutrition >75% (most of the time)    Physical Activity >75% (most of the time)    Medications >75% (most of the time)    Monitoring >75% (most of the time)    Problem Solving and behavior change strategies  >75% (most of the time)    Reducing Risk (treating acute and chronic complications) >75% (most of the time)    Health Coping >75% (most of the time)      Post-Education Assessment  Patient understands the diabetes disease and treatment process. Demonstrates understanding / competency    Patient understands incorporating nutritional management into lifestyle. Comprehends key points    Patient undertands incorporating physical activity into lifestyle. Demonstrates understanding / competency    Patient understands using medications safely. Demonstrates understanding / competency    Patient understands monitoring blood glucose, interpreting and using results Needs Review    Patient understands prevention, detection, and treatment of acute complications. Comprehends key points    Patient understands prevention, detection, and treatment of chronic complications. Comprehends key points    Patient understands how to develop strategies to address psychosocial issues. Comprehends key points    Patient understands how to develop strategies to promote health/change behavior. Demonstrates understanding / competency      Outcomes   Expected Outcomes Demonstrated interest in learning. Expect positive outcomes    Future DMSE 6 months    Program Status Not Completed      Subsequent Visit   Since your last visit have you continued or begun to take your medications as prescribed? Yes    Since your last visit have you experienced any weight changes? Loss    Weight Loss (lbs) 10    Since your last visit, are you checking your blood glucose at least once a day? Yes          Individualized Plan for Diabetes Self-Management Training:   Learning Objective:  Patient will have a greater understanding of  diabetes self-management. Patient education plan is to attend individual and/or group sessions per assessed needs and concerns.   Plan:   Patient Instructions  Your A1C was 5.2% which is normal because of all of your hard work! Keep taking your Lantus  each morning and Mounjaro  weekly as well as other medications as prescribed.  Be sure that you are eating enough to fuel your body and maintain muscle!  Sometimes we are tired because we work hard and sometimes we are tired because we don't fuel enough.  Probably both.    Add fresh fruit and more vegetables.  Restaurant options:  [employer] (lunch only) Choose foods that are baked, boiled or grilled rather than fried. Avoid/limit added fat such as oil, butter, gravy, sour cream. Limit processed meat  You are staying active.  Continue to be active daily.  Weight lifting twice per week which your job fulfills.  This is to maintain your muscle mass.  Expected Outcomes:  Demonstrated interest in learning. Expect positive outcomes  Education material provided:   If problems or questions, patient to contact team via:  Phone  Future DSME appointment: 6 months

## 2024-09-08 NOTE — Patient Instructions (Addendum)
 Your A1C was 5.2% which is normal because of all of your hard work! Keep taking your Lantus  each morning and Mounjaro  weekly as well as other medications as prescribed.  Be sure that you are eating enough to fuel your body and maintain muscle!  Sometimes we are tired because we work hard and sometimes we are tired because we don't fuel enough.  Probably both.    Add fresh fruit and more vegetables.  Restaurant options:  [employer] (lunch only) Choose foods that are baked, boiled or grilled rather than fried. Avoid/limit added fat such as oil, butter, gravy, sour cream. Limit processed meat  You are staying active.  Continue to be active daily.  Weight lifting twice per week which your job fulfills.  This is to maintain your muscle mass.

## 2024-09-11 ENCOUNTER — Encounter: Payer: Self-pay | Admitting: Family Medicine

## 2024-09-11 ENCOUNTER — Other Ambulatory Visit: Payer: Self-pay | Admitting: Family Medicine

## 2024-09-11 DIAGNOSIS — E1165 Type 2 diabetes mellitus with hyperglycemia: Secondary | ICD-10-CM

## 2024-09-11 MED ORDER — TIRZEPATIDE 7.5 MG/0.5ML ~~LOC~~ SOAJ: 7.5000 mg | SUBCUTANEOUS | 3 refills | Status: DC

## 2024-09-16 ENCOUNTER — Other Ambulatory Visit: Payer: Self-pay | Admitting: Family Medicine

## 2024-09-16 DIAGNOSIS — I10 Essential (primary) hypertension: Secondary | ICD-10-CM

## 2024-09-20 NOTE — Progress Notes (Signed)
 Subjective:    Patient ID:  The patient is a 43 y.o. for a TB skin test.                                                                               Objective:      Assessment/Plan:    PPD read completed and is NEGATIVE.  Results and recommendations reviewed with and acknowledged by the patient in accordance with [facility] Guidelines.

## 2024-10-02 ENCOUNTER — Encounter: Payer: Self-pay | Admitting: Emergency Medicine

## 2024-10-02 ENCOUNTER — Ambulatory Visit
Admission: EM | Admit: 2024-10-02 | Discharge: 2024-10-02 | Disposition: A | Discharge status: Home / Self Care | Attending: Family Medicine | Admitting: Family Medicine

## 2024-10-02 DIAGNOSIS — E139 Other specified diabetes mellitus without complications: Secondary | ICD-10-CM

## 2024-10-02 DIAGNOSIS — R112 Nausea with vomiting, unspecified: Secondary | ICD-10-CM

## 2024-10-02 DIAGNOSIS — R197 Diarrhea, unspecified: Secondary | ICD-10-CM | POA: Diagnosis not present

## 2024-10-02 DIAGNOSIS — Z794 Long term (current) use of insulin: Secondary | ICD-10-CM | POA: Diagnosis not present

## 2024-10-02 LAB — POCT URINE DIPSTICK
Bilirubin, UA: NEGATIVE
Blood, UA: NEGATIVE
Glucose, UA: NEGATIVE mg/dL
Ketones, POC UA: NEGATIVE mg/dL
Leukocytes, UA: NEGATIVE
Nitrite, UA: NEGATIVE
POC PROTEIN,UA: NEGATIVE
Spec Grav, UA: 1.015
Urobilinogen, UA: 0.2 U/dL
pH, UA: 6

## 2024-10-02 LAB — GLUCOSE, POCT (MANUAL RESULT ENTRY): POCT Glucose (KUC): 100 mg/dL — AB (ref 70–99)

## 2024-10-02 MED ORDER — DIPHENOXYLATE-ATROPINE 2.5-0.025 MG PO TABS: 1.0000 | ORAL_TABLET | Freq: Four times a day (QID) | ORAL | 0 refills | Status: AC | PRN

## 2024-10-02 MED ORDER — ONDANSETRON 4 MG PO TBDP: 4.0000 mg | ORAL_TABLET | Freq: Three times a day (TID) | ORAL | 0 refills | Status: DC | PRN

## 2024-10-02 NOTE — Discharge Instructions (Signed)
 Your sugar was 100 when we checked.  Urinalysis did not show any blood or ketones or white blood cells.  Diphenoxylate-atropine-take 1 tablet 4 times a day as needed for diarrhea  Ondansetron  dissolved in the mouth every 8 hours as needed for nausea or vomiting. Clear liquids(water, gatorade/pedialyte, ginger ale/sprite, chicken broth/soup) and bland things(crackers/toast, rice, potato, bananas) to eat. Avoid acidic foods like lemon/lime/orange/tomato, and avoid greasy/spicy foods.  This most likely is a viral illness, but if you worsen in any way, please go to the emergency room for further evaluation.  Please follow-up with your primary care

## 2024-10-02 NOTE — ED Provider Notes (Signed)
 " [facility]    CSN: [id number] Arrival date & time: 10/02/24  0915      History   Chief Complaint Chief Complaint  Patient presents with   Abdominal Pain    HPI Thomas King is a 43 y.o. male.    Abdominal Pain  Here for abdominal pain and diarrhea.  Symptoms began on about December 24.  He has had intermittent abdominal pain, mainly bothering him in the epigastric area but sometimes it is all over.  He has had several loose stools daily.  No nausea or vomiting until this morning when he had 2 episodes of emesis.  He does not have any nausea so he does not want the ondansetron  offered at triage.  When he has the pain it is ranked an 8 or 9 out of 10, but he is not having any pain while being seen here in the office.  He has had 5 episodes this morning of diarrhea. No fever or chills and no upper respiratory symptoms.  Tylenol  has not helped.  He does have diabetes.  His sugars have been doing well.  When he was initially diagnosed he had a sugar of 600 and did have to be admitted, but he states it is unclear if he has type I or type 2 diabetes.  He does use insulin .  His freestyle libre pod has been out and he has to pick 1 up from the pharmacy, so he has not checked it this morning.  He is allergic to shellfish   Past Medical History:  Diagnosis Date   Diabetes mellitus without complication (HCC)    Frequent headaches    Hypertension     Patient Active Problem List   Diagnosis Date Noted   OSA (obstructive sleep apnea) 08/25/2024   Diabetic ketoacidosis without coma associated with type 2 diabetes mellitus (HCC) 06/18/2024   Morbid obesity (HCC) 05/19/2024   Snoring 05/19/2024   Chronic pain of right ankle 05/19/2024   Penile discharge 06/30/2022   Seborrhea 06/30/2022   Family history of brain aneurysm 05/28/2022   Encounters for administrative purposes 07/28/2018   Essential hypertension 10/01/2014   Hyperlipidemia 10/01/2014   Ulna fracture  09/14/2014   Left knee pain 10/11/2013   Obesity (BMI 30-39.9) 07/29/2013   Class 1 obesity due to excess calories with serious comorbidity and body mass index (BMI) of 34.0 to 34.9 in adult 07/29/2013    Past Surgical History:  Procedure Laterality Date   ARTHROSCOPIC REPAIR ACL Left    x's 2        Home Medications    Prior to Admission medications  Medication Sig Start Date End Date Taking? Authorizing Provider  amLODipine  (NORVASC ) 10 MG tablet Take 1 tablet (10 mg total) by mouth daily. 09/18/24  Yes Antonio Cyndee Rockers R, DO  Continuous Glucose Sensor (FREESTYLE LIBRE 3 PLUS SENSOR) MISC Change sensor every 15 days. 06/20/24  Yes Lowne Chase, Yvonne R, DO  diphenoxylate-atropine (LOMOTIL) 2.5-0.025 MG tablet Take 1 tablet by mouth 4 (four) times daily as needed for diarrhea or loose stools. 10/02/24  Yes Vonna Sharlet POUR, MD  insulin  glargine (LANTUS ) 100 UNIT/ML Solostar Pen Inject 22 Units into the skin daily. 06/20/24  Yes Verdene Purchase, MD  Insulin  Pen Needle (PEN NEEDLES) 31G X 5 MM MISC Use to inject insulin  three times daily 06/20/24  Yes Krishnan, Gokul, MD  naproxen  (NAPROSYN ) 500 MG tablet Take 1 tablet (500 mg total) by mouth 2 (two) times daily as  needed (fever or body aches). 10/12/23  Yes Arloa Suzen RAMAN, NP  ondansetron  (ZOFRAN -ODT) 4 MG disintegrating tablet Take 1 tablet (4 mg total) by mouth every 8 (eight) hours as needed for nausea or vomiting. 10/02/24  Yes Vonna Sharlet POUR, MD  tirzepatide  (MOUNJARO ) 7.5 MG/0.5ML Pen Inject 7.5 mg into the skin once a week. 09/11/24  Yes Antonio Cyndee Rockers R, DO  amLODipine  (NORVASC ) 5 MG tablet Oral Patient not taking: Reported on 10/02/2024 09/18/20   [provider]  Blood Glucose Monitoring Suppl (BLOOD GLUCOSE MONITOR SYSTEM) w/Device KIT Use to check blood sugar three times daily 06/20/24   Krishnan, Gokul, MD  Ciclopirox  1 % shampoo Massage into scalp , wait 5 min then rinse, 2-3 x a week Patient not  taking: Reported on 10/02/2024 08/25/24   Lowne Chase, Yvonne R, DO  Glucose Blood (BLOOD GLUCOSE TEST STRIPS) STRP Use to check blood sugar three times daily 06/20/24   Krishnan, Gokul, MD  insulin  aspart (NOVOLOG ) 100 UNIT/ML FlexPen Inject 5 Units into the skin 3 (three) times daily with meals. If eating and Blood Glucose (BG) 80 or higher inject 5 units for meal coverage and add correction dose per scale. If not eating, correction dose only. BG <150= 0 unit; BG 150-200= 1 unit; BG 201-250= 2 unit; BG 251-300= 3 unit; BG 301-350= 4 unit; BG 351-400= 5 unit; BG >400= 6 unit and Call Primary Care. 06/20/24   Krishnan, Gokul, MD  Lancet Device MISC 1 each by Does not apply route 3 (three) times daily. May dispense any manufacturer covered by patient's insurance. 06/20/24   Verdene Purchase, MD  Lancets MISC Use to check blood sugar three times daily 06/20/24   Krishnan, Gokul, MD  potassium chloride  SA (KLOR-CON  M) 20 MEQ tablet Take 1 tablet (20 mEq total) by mouth daily for 5 days. Patient not taking: Reported on 08/25/2024 06/20/24 07/06/24  Verdene Purchase, MD    Family History Family History  Problem Relation Age of Onset   Hypertension Mother    Selective mutism Neg Hx    Sleep apnea Neg Hx     Social History Social History[1]   Allergies   Other and Shellfish allergy   Review of Systems Review of Systems  Gastrointestinal:  Positive for abdominal pain.     Physical Exam Triage Vital Signs ED Triage Vitals  Encounter Vitals Group     BP 10/02/24 1048 128/87     Girls Systolic BP Percentile --      Girls Diastolic BP Percentile --      Boys Systolic BP Percentile --      Boys Diastolic BP Percentile --      Pulse Rate 10/02/24 1048 92     Resp 10/02/24 1048 12     Temp 10/02/24 1048 98.4 F (36.9 C)     Temp Source 10/02/24 1048 Oral     SpO2 10/02/24 1048 94 %     Weight --      Height --      Head Circumference --      Peak Flow --      Pain Score 10/02/24 1045 7      Pain Loc --      Pain Education --      Exclude from Growth Chart --    No data found.  Updated Vital Signs BP 128/87 (BP Location: Left Arm)   Pulse 92   Temp 98.4 F (36.9 C) (Oral)   Resp 12  SpO2 94%   Visual Acuity Right Eye Distance:   Left Eye Distance:   Bilateral Distance:    Right Eye Near:   Left Eye Near:    Bilateral Near:     Physical Exam Vitals reviewed.  Constitutional:      General: He is not in acute distress.    Appearance: He is not toxic-appearing.  HENT:     Mouth/Throat:     Mouth: Mucous membranes are moist.     Pharynx: No oropharyngeal exudate or posterior oropharyngeal erythema.  Eyes:     Extraocular Movements: Extraocular movements intact.     Conjunctiva/sclera: Conjunctivae normal.     Pupils: Pupils are equal, round, and reactive to light.  Cardiovascular:     Rate and Rhythm: Normal rate and regular rhythm.     Heart sounds: No murmur heard. Pulmonary:     Effort: Pulmonary effort is normal. No respiratory distress.     Breath sounds: Normal breath sounds. No stridor. No wheezing or rhonchi.  Abdominal:     General: Bowel sounds are normal. There is no distension.     Palpations: Abdomen is soft.     Tenderness: There is no guarding.     Comments: There is mild epigastric tenderness.  Musculoskeletal:     Cervical back: Neck supple.  Lymphadenopathy:     Cervical: No cervical adenopathy.  Skin:    Capillary Refill: Capillary refill takes less than 2 seconds.     Coloration: Skin is not jaundiced or pale.  Neurological:     General: No focal deficit present.     Mental Status: He is alert and oriented to person, place, and time.  Psychiatric:        Behavior: Behavior normal.      UC Treatments / Results  Labs (all labs ordered are listed, but only abnormal results are displayed) Labs Reviewed  GLUCOSE, POCT (MANUAL RESULT ENTRY) - Abnormal; Notable for the following components:      Result Value   POCT Glucose  ([facility]) 100 (*)    All other components within normal limits  POCT URINE DIPSTICK    EKG   Radiology No results found.  Procedures Procedures (including critical care time)  Medications Ordered in UC Medications - No data to display  Initial Impression / Assessment and Plan / UC Course  I have reviewed the triage vital signs and the nursing notes.  Pertinent labs & imaging results that were available during my care of the patient were reviewed by me and considered in my medical decision making (see chart for details).     Fingerstick glucose here is 100. Urinalysis is negative for ketones, nitrites, leukocytes or red blood cells. Final Clinical Impressions(s) / UC Diagnoses   Final diagnoses:  Nausea vomiting and diarrhea  Other specified diabetes mellitus without complication, with long-term current use of insulin  [facility])     Discharge Instructions      Your sugar was 100 when we checked.  Urinalysis did not show any blood or ketones or white blood cells.  Diphenoxylate-atropine-take 1 tablet 4 times a day as needed for diarrhea  Ondansetron  dissolved in the mouth every 8 hours as needed for nausea or vomiting. Clear liquids(water, gatorade/pedialyte, ginger ale/sprite, chicken broth/soup) and bland things(crackers/toast, rice, potato, bananas) to eat. Avoid acidic foods like lemon/lime/orange/tomato, and avoid greasy/spicy foods.  This most likely is a viral illness, but if you worsen in any way, please go to the emergency room for further evaluation.  Please follow-up with your primary care     ED Prescriptions     Medication Sig Dispense Auth. Provider   diphenoxylate-atropine (LOMOTIL) 2.5-0.025 MG tablet Take 1 tablet by mouth 4 (four) times daily as needed for diarrhea or loose stools. 16 tablet Lucee Brissett K, MD   ondansetron  (ZOFRAN -ODT) 4 MG disintegrating tablet Take 1 tablet (4 mg total) by mouth every 8 (eight) hours as needed for nausea or  vomiting. 10 tablet Vonna Sharlet POUR, MD      I have reviewed the PDMP during this encounter.     [1]  Social History Tobacco Use   Smoking status: Never   Smokeless tobacco: Never  Vaping Use   Vaping status: Never Used  Substance Use Topics   Alcohol use: Not Currently   Drug use: No     Vonna Sharlet POUR, MD 10/02/24 1204  "

## 2024-10-02 NOTE — ED Triage Notes (Addendum)
 Pt reports generalized abdominal pain x3 days. Reports consistent diarrhea over this period and emesis episodes that started yesterday. 2 emesis episodes today last one within the last . Denies ongoing nausea and declines ondansetron  standing order. 5 diarrhea episodes since early this AM. No abnormal foods, new meds, or sick contacts prior to symptoms started. Tylenol  taken with no relief. No other med use. Able to keep fluids down.

## 2024-10-03 ENCOUNTER — Other Ambulatory Visit: Payer: Self-pay

## 2024-10-03 ENCOUNTER — Encounter: Payer: Self-pay | Admitting: Family Medicine

## 2024-10-03 ENCOUNTER — Encounter (HOSPITAL_BASED_OUTPATIENT_CLINIC_OR_DEPARTMENT_OTHER): Payer: Self-pay | Admitting: Emergency Medicine

## 2024-10-03 ENCOUNTER — Emergency Department (HOSPITAL_BASED_OUTPATIENT_CLINIC_OR_DEPARTMENT_OTHER)
Admission: EM | Admit: 2024-10-03 | Discharge: 2024-10-04 | Disposition: A | Discharge status: Home / Self Care | Attending: Emergency Medicine | Admitting: Emergency Medicine

## 2024-10-03 ENCOUNTER — Emergency Department (HOSPITAL_BASED_OUTPATIENT_CLINIC_OR_DEPARTMENT_OTHER)

## 2024-10-03 DIAGNOSIS — R112 Nausea with vomiting, unspecified: Secondary | ICD-10-CM | POA: Diagnosis not present

## 2024-10-03 DIAGNOSIS — Z794 Long term (current) use of insulin: Secondary | ICD-10-CM | POA: Insufficient documentation

## 2024-10-03 DIAGNOSIS — I10 Essential (primary) hypertension: Secondary | ICD-10-CM | POA: Insufficient documentation

## 2024-10-03 DIAGNOSIS — Z79899 Other long term (current) drug therapy: Secondary | ICD-10-CM | POA: Diagnosis not present

## 2024-10-03 DIAGNOSIS — E119 Type 2 diabetes mellitus without complications: Secondary | ICD-10-CM | POA: Diagnosis not present

## 2024-10-03 DIAGNOSIS — R197 Diarrhea, unspecified: Secondary | ICD-10-CM | POA: Diagnosis not present

## 2024-10-03 DIAGNOSIS — R1013 Epigastric pain: Secondary | ICD-10-CM | POA: Insufficient documentation

## 2024-10-03 LAB — COMPREHENSIVE METABOLIC PANEL WITH GFR
ALT: 17 U/L (ref 0–44)
AST: 23 U/L (ref 15–41)
Albumin: 4.6 g/dL (ref 3.5–5.0)
Alkaline Phosphatase: 121 U/L (ref 38–126)
Anion gap: 13 (ref 5–15)
BUN: 8 mg/dL (ref 6–20)
CO2: 25 mmol/L (ref 22–32)
Calcium: 9.7 mg/dL (ref 8.9–10.3)
Chloride: 103 mmol/L (ref 98–111)
Creatinine, Ser: 1.21 mg/dL (ref 0.61–1.24)
GFR, Estimated: >60 mL/min
Glucose, Bld: 102 mg/dL — ABNORMAL HIGH (ref 70–99)
Potassium: 3.3 mmol/L — ABNORMAL LOW (ref 3.5–5.1)
Sodium: 141 mmol/L (ref 135–145)
Total Bilirubin: 0.5 mg/dL (ref 0.0–1.2)
Total Protein: 8.3 g/dL — ABNORMAL HIGH (ref 6.5–8.1)

## 2024-10-03 LAB — URINALYSIS, ROUTINE W REFLEX MICROSCOPIC
Bilirubin Urine: NEGATIVE
Glucose, UA: NEGATIVE mg/dL
Hgb urine dipstick: NEGATIVE
Ketones, ur: NEGATIVE mg/dL
Leukocytes,Ua: NEGATIVE
Nitrite: NEGATIVE
Protein, ur: NEGATIVE mg/dL
Specific Gravity, Urine: 1.025 (ref 1.005–1.030)
pH: 5.5 (ref 5.0–8.0)

## 2024-10-03 LAB — CBC
HCT: 51.8 % (ref 39.0–52.0)
Hemoglobin: 17 g/dL (ref 13.0–17.0)
MCH: 28.3 pg (ref 26.0–34.0)
MCHC: 32.8 g/dL (ref 30.0–36.0)
MCV: 86.2 fL (ref 80.0–100.0)
Platelets: 302 K/uL (ref 150–400)
RBC: 6.01 MIL/uL — ABNORMAL HIGH (ref 4.22–5.81)
RDW: 12.6 % (ref 11.5–15.5)
WBC: 9.8 K/uL (ref 4.0–10.5)
nRBC: 0 % (ref 0.0–0.2)

## 2024-10-03 LAB — LIPASE, BLOOD: Lipase: 19 U/L (ref 11–51)

## 2024-10-03 MED ORDER — IOHEXOL 300 MG/ML  SOLN
100.0000 mL | Freq: Once | INTRAMUSCULAR | Status: AC | PRN
  Administered 2024-10-03: 100 mL via INTRAVENOUS

## 2024-10-03 MED ORDER — MORPHINE SULFATE (PF) 4 MG/ML IV SOLN
4.0000 mg | Freq: Once | INTRAVENOUS | Status: AC
  Administered 2024-10-03: 4 mg via INTRAVENOUS
  Filled 2024-10-03: qty 1

## 2024-10-03 MED ORDER — SODIUM CHLORIDE 0.9 % IV BOLUS
1000.0000 mL | Freq: Once | INTRAVENOUS | Status: AC
  Administered 2024-10-03: 1000 mL via INTRAVENOUS

## 2024-10-03 MED ORDER — POTASSIUM CHLORIDE CRYS ER 20 MEQ PO TBCR
40.0000 meq | EXTENDED_RELEASE_TABLET | Freq: Once | ORAL | Status: AC
  Administered 2024-10-03: 40 meq via ORAL
  Filled 2024-10-03: qty 2

## 2024-10-03 NOTE — ED Notes (Signed)
 Patient transported to CT

## 2024-10-03 NOTE — ED Provider Notes (Signed)
 " Thomas King Provider Note   CSN: [id number] Arrival date & time: 10/03/24  8090     Patient presents with: Abdominal Pain   Thomas King is a 43 y.o. male.  {Add pertinent medical, surgical, social history, OB history to YEP:67052} Patient with history of diabetes, hypertension presents today with complaints of abdominal pain. Reports that symptoms have been going on since December 24 when he developed epigastric pain with nausea, vomiting, and diarrhea.  Has any history of similar symptoms previously.  No history of abdominal surgeries.  No sick contacts.  No fevers or chills.  Reports that he is having greater than 10 episodes of watery diarrhea today.  Denies hematochezia or melena. Reports that he has not had emesis in a few days and has been drinking plenty of Gatorade to maintain hydration. Went to urgent care yesterday and was prescribed lomotil which he has taken without any improvement in symptoms. Denies recent travel or suspicious food/water intake.   The history is provided by the patient. No language interpreter was used.  Abdominal Pain Associated symptoms: diarrhea, nausea and vomiting        Prior to Admission medications  Medication Sig Start Date End Date Taking? Authorizing Provider  amLODipine  (NORVASC ) 10 MG tablet Take 1 tablet (10 mg total) by mouth daily. 09/18/24   Antonio Cyndee Jamee JONELLE, DO  amLODipine  (NORVASC ) 5 MG tablet Oral Patient not taking: Reported on 10/02/2024 09/18/20   [provider]  Blood Glucose Monitoring Suppl (BLOOD GLUCOSE MONITOR SYSTEM) w/Device KIT Use to check blood sugar three times daily 06/20/24   Krishnan, Gokul, MD  Ciclopirox  1 % shampoo Massage into scalp , wait 5 min then rinse, 2-3 x a week Patient not taking: Reported on 10/02/2024 08/25/24   Antonio Cyndee Jamee R, DO  Continuous Glucose Sensor (FREESTYLE LIBRE 3 PLUS SENSOR) MISC Change sensor every 15 days. 06/20/24    Lowne Chase, Yvonne R, DO  diphenoxylate-atropine (LOMOTIL) 2.5-0.025 MG tablet Take 1 tablet by mouth 4 (four) times daily as needed for diarrhea or loose stools. 10/02/24   Vonna Sharlet POUR, MD  Glucose Blood (BLOOD GLUCOSE TEST STRIPS) STRP Use to check blood sugar three times daily 06/20/24   Krishnan, Gokul, MD  insulin  aspart (NOVOLOG ) 100 UNIT/ML FlexPen Inject 5 Units into the skin 3 (three) times daily with meals. If eating and Blood Glucose (BG) 80 or higher inject 5 units for meal coverage and add correction dose per scale. If not eating, correction dose only. BG <150= 0 unit; BG 150-200= 1 unit; BG 201-250= 2 unit; BG 251-300= 3 unit; BG 301-350= 4 unit; BG 351-400= 5 unit; BG >400= 6 unit and Call Primary Care. 06/20/24   Krishnan, Gokul, MD  insulin  glargine (LANTUS ) 100 UNIT/ML Solostar Pen Inject 22 Units into the skin daily. 06/20/24   Verdene Purchase, MD  Insulin  Pen Needle (PEN NEEDLES) 31G X 5 MM MISC Use to inject insulin  three times daily 06/20/24   Krishnan, Gokul, MD  Lancet Device MISC 1 each by Does not apply route 3 (three) times daily. May dispense any manufacturer covered by patient's insurance. 06/20/24   Verdene Purchase, MD  Lancets MISC Use to check blood sugar three times daily 06/20/24   Krishnan, Gokul, MD  naproxen  (NAPROSYN ) 500 MG tablet Take 1 tablet (500 mg total) by mouth 2 (two) times daily as needed (fever or body aches). 10/12/23   Arloa Suzen RAMAN, NP  ondansetron  (ZOFRAN -ODT)  4 MG disintegrating tablet Take 1 tablet (4 mg total) by mouth every 8 (eight) hours as needed for nausea or vomiting. 10/02/24   Banister, Pamela K, MD  potassium chloride  SA (KLOR-CON  M) 20 MEQ tablet Take 1 tablet (20 mEq total) by mouth daily for 5 days. Patient not taking: Reported on 08/25/2024 06/20/24 07/06/24  Krishnan, Gokul, MD  tirzepatide  (MOUNJARO ) 7.5 MG/0.5ML Pen Inject 7.5 mg into the skin once a week. 09/11/24   Antonio Cyndee Jamee JONELLE, DO    Allergies: Other and Shellfish  allergy    Review of Systems  Gastrointestinal:  Positive for abdominal pain, diarrhea, nausea and vomiting.  All other systems reviewed and are negative.   Updated Vital Signs BP (!) 136/93 (BP Location: Right Arm)   Pulse 87   Temp 98.4 F (36.9 C)   Resp 16   Ht 5' 9 (1.753 m)   Wt 100.2 kg   SpO2 98%   BMI 32.64 kg/m   Physical Exam Vitals and nursing note reviewed.  Constitutional:      General: He is not in acute distress.    Appearance: Normal appearance. He is normal weight. He is not ill-appearing, toxic-appearing or diaphoretic.  HENT:     Head: Normocephalic and atraumatic.  Cardiovascular:     Rate and Rhythm: Normal rate.  Pulmonary:     Effort: Pulmonary effort is normal. No respiratory distress.  Abdominal:     General: Abdomen is flat.     Palpations: Abdomen is soft.     Tenderness: There is abdominal tenderness in the epigastric area. There is no guarding or rebound.  Musculoskeletal:        General: Normal range of motion.     Cervical back: Normal range of motion.  Skin:    General: Skin is warm and dry.  Neurological:     General: No focal deficit present.     Mental Status: He is alert.  Psychiatric:        Mood and Affect: Mood normal.        Behavior: Behavior normal.     (all labs ordered are listed, but only abnormal results are displayed) Labs Reviewed  COMPREHENSIVE METABOLIC PANEL WITH GFR - Abnormal; Notable for the following components:      Result Value   Potassium 3.3 (*)    Glucose, Bld 102 (*)    Total Protein 8.3 (*)    All other components within normal limits  CBC - Abnormal; Notable for the following components:   RBC 6.01 (*)    All other components within normal limits  LIPASE, BLOOD  URINALYSIS, ROUTINE W REFLEX MICROSCOPIC    EKG: None  Radiology: No results found.  {Document cardiac monitor, telemetry assessment procedure when appropriate:32947} Procedures   Medications Ordered in the ED  morphine   (PF) 4 MG/ML injection 4 mg (4 mg Intravenous Given 10/03/24 2119)  sodium chloride  0.9 % bolus 1,000 mL (1,000 mLs Intravenous New Bag/Given 10/03/24 2119)      {Click here for ABCD2, HEART and other calculators REFRESH Note before signing:1}                              Medical Decision Making Amount and/or Complexity of Data Reviewed Labs: ordered. Radiology: ordered.  Risk Prescription drug management.   This patient is a 43 y.o. male who presents to the ED for concern of abdominal pain, nausea, vomiting, diarrhea, this involves  an extensive number of treatment options, and is a complaint that carries with it a high risk of complications and morbidity. The emergent differential diagnosis prior to evaluation includes, but is not limited to,   AAA, gastroenteritis, appendicitis, Bowel obstruction, Bowel perforation. Gastroparesis, DKA, Hernia, Inflammatory bowel disease, mesenteric ischemia, pancreatitis, peritonitis SBP, volvulus.  This is not an exhaustive differential.   Past Medical History / Co-morbidities / Social History:  has a past medical history of Diabetes mellitus without complication (HCC), Frequent headaches, and Hypertension.  Additional history: Chart reviewed. Pertinent results include: seen at urgent care yesterday, had normal CBG and UA, was discharged with zofran  and lomotil  Physical Exam: Physical exam performed. The pertinent findings include: generalized abdominal tenderness to palpation without rebound or guarding  Lab Tests: I ordered, and personally interpreted labs.  The pertinent results include:  K 3.3. No other acute laboratory abnormalities   Imaging Studies: I ordered imaging studies including CT abdomen pelvis. I independently visualized and interpreted imaging which showed ***. I agree with the radiologist interpretation.   Cardiac Monitoring:  The patient was maintained on a cardiac monitor.  My attending physician Dr. PIERRETTE viewed and  interpreted the cardiac monitored which showed an underlying rhythm of: ***. I agree with this interpretation.   Medications: I ordered medication including ***  for ***. Reevaluation of the patient after these medicines showed that the patient {resolved/improved/worsened:23923::improved}. I have reviewed the patients home medicines and have made adjustments as needed.  Consultations Obtained: I requested consultation with the ***,  and discussed lab and imaging findings as well as pertinent plan - they recommend: ***   Disposition: After consideration of the diagnostic results and the patients response to treatment, I feel that *** .   ***emergency department workup does not suggest an emergent condition requiring admission or immediate intervention beyond what has been performed at this time. The plan is: ***. The patient is safe for discharge and has been instructed to return immediately for worsening symptoms, change in symptoms or any other concerns.  I discussed this case with my attending physician Dr. PIERRETTE who cosigned this note including patient's presenting symptoms, physical exam, and planned diagnostics and interventions. Attending physician stated agreement with plan or made changes to plan which were implemented.     {Document critical care time when appropriate  Document review of labs and clinical decision tools ie CHADS2VASC2, etc  Document your independent review of radiology images and any outside records  Document your discussion with family members, caretakers and with consultants  Document social determinants of health affecting pt's care  Document your decision making why or why not admission, treatments were needed:32947:::1}   Final diagnoses:  None    ED Discharge Orders     None        "

## 2024-10-03 NOTE — ED Triage Notes (Signed)
 Pt c/o epigastric abd pain, diarrhea x 4 days. Denies fever.

## 2024-10-03 NOTE — Telephone Encounter (Signed)
 Tried to schedule pt. None of our open slots worked for him so he declined to schedule. [place] he will call back if he does not feel better

## 2024-10-04 NOTE — Discharge Instructions (Signed)
 Your CT scan read that you could have early appendicitis.  I think this is a bit less likely based on your history and exam.  Certainly if you develop right lower abdominal discomfort fever or inability to eat or drink please return for repeat assess.  Continue to take the Zofran  as needed for nausea.  You can take Imodium for diarrhea.  Please follow the instructions on the bottle.  This is purchased over-the-counter.

## 2024-10-04 NOTE — ED Provider Notes (Signed)
 Received patient in turnover from Dr. Dreama.  Please see their note for further details of Hx, PE.  Briefly patient is a 43 y.o. male with a Abdominal Pain .  Patient with nausea vomiting and diarrhea.  Awaiting CT imaging.   CT imaging radiology read with concern for maybe early appendicitis.  I reassessed the patient.  Feeling much better.  No pain with palpation to the right lower quadrant.  I discussed the read with the patient and family.  Would like to go home at this time.  Return precautions given.    Emil Share, DO 10/04/24 0202

## 2024-10-05 ENCOUNTER — Emergency Department (HOSPITAL_BASED_OUTPATIENT_CLINIC_OR_DEPARTMENT_OTHER)

## 2024-10-05 ENCOUNTER — Other Ambulatory Visit: Payer: Self-pay

## 2024-10-05 ENCOUNTER — Encounter (HOSPITAL_COMMUNITY): Payer: Self-pay | Admitting: Anesthesiology

## 2024-10-05 ENCOUNTER — Encounter (HOSPITAL_BASED_OUTPATIENT_CLINIC_OR_DEPARTMENT_OTHER): Payer: Self-pay | Admitting: Emergency Medicine

## 2024-10-05 ENCOUNTER — Emergency Department (HOSPITAL_BASED_OUTPATIENT_CLINIC_OR_DEPARTMENT_OTHER)
Admission: EM | Admit: 2024-10-05 | Discharge: 2024-10-05 | Disposition: A | Discharge status: Home / Self Care | Attending: Emergency Medicine | Admitting: Emergency Medicine

## 2024-10-05 DIAGNOSIS — R112 Nausea with vomiting, unspecified: Secondary | ICD-10-CM | POA: Insufficient documentation

## 2024-10-05 DIAGNOSIS — R197 Diarrhea, unspecified: Secondary | ICD-10-CM | POA: Insufficient documentation

## 2024-10-05 DIAGNOSIS — R109 Unspecified abdominal pain: Secondary | ICD-10-CM

## 2024-10-05 DIAGNOSIS — R1084 Generalized abdominal pain: Secondary | ICD-10-CM | POA: Diagnosis present

## 2024-10-05 LAB — CBC WITH DIFFERENTIAL/PLATELET
Abs Immature Granulocytes: 0.07 K/uL (ref 0.00–0.07)
Basophils Absolute: 0 K/uL (ref 0.0–0.1)
Basophils Relative: 0 %
Eosinophils Absolute: 0.8 K/uL — ABNORMAL HIGH (ref 0.0–0.5)
Eosinophils Relative: 6 %
HCT: 51.7 % (ref 39.0–52.0)
Hemoglobin: 17.2 g/dL — ABNORMAL HIGH (ref 13.0–17.0)
Immature Granulocytes: 1 %
Lymphocytes Relative: 25 %
Lymphs Abs: 3.1 K/uL (ref 0.7–4.0)
MCH: 28.5 pg (ref 26.0–34.0)
MCHC: 33.3 g/dL (ref 30.0–36.0)
MCV: 85.6 fL (ref 80.0–100.0)
Monocytes Absolute: 0.9 K/uL (ref 0.1–1.0)
Monocytes Relative: 7 %
Neutro Abs: 7.7 K/uL (ref 1.7–7.7)
Neutrophils Relative %: 61 %
Platelets: 396 K/uL (ref 150–400)
RBC: 6.04 MIL/uL — ABNORMAL HIGH (ref 4.22–5.81)
RDW: 12.6 % (ref 11.5–15.5)
WBC: 12.6 K/uL — ABNORMAL HIGH (ref 4.0–10.5)
nRBC: 0 % (ref 0.0–0.2)

## 2024-10-05 LAB — COMPREHENSIVE METABOLIC PANEL WITH GFR
ALT: 17 U/L (ref 0–44)
AST: 18 U/L (ref 15–41)
Albumin: 4.3 g/dL (ref 3.5–5.0)
Alkaline Phosphatase: 115 U/L (ref 38–126)
Anion gap: 15 (ref 5–15)
BUN: 9 mg/dL (ref 6–20)
CO2: 26 mmol/L (ref 22–32)
Calcium: 9.4 mg/dL (ref 8.9–10.3)
Chloride: 100 mmol/L (ref 98–111)
Creatinine, Ser: 1.33 mg/dL — ABNORMAL HIGH (ref 0.61–1.24)
GFR, Estimated: >60 mL/min
Glucose, Bld: 176 mg/dL — ABNORMAL HIGH (ref 70–99)
Potassium: 3.2 mmol/L — ABNORMAL LOW (ref 3.5–5.1)
Sodium: 140 mmol/L (ref 135–145)
Total Bilirubin: 0.6 mg/dL (ref 0.0–1.2)
Total Protein: 7.6 g/dL (ref 6.5–8.1)

## 2024-10-05 LAB — C DIFFICILE QUICK SCREEN W PCR REFLEX
C Diff antigen: NEGATIVE
C Diff interpretation: NOT DETECTED
C Diff toxin: NEGATIVE

## 2024-10-05 LAB — LACTIC ACID, PLASMA
Lactic Acid, Venous: 2.1 mmol/L (ref 0.5–1.9)
Lactic Acid, Venous: 1.3 mmol/L (ref 0.5–1.9)

## 2024-10-05 SURGERY — APPENDECTOMY, LAPAROSCOPIC
Anesthesia: Choice

## 2024-10-05 MED ORDER — LOPERAMIDE HCL 2 MG PO CAPS: 2.0000 mg | ORAL_CAPSULE | Freq: Four times a day (QID) | ORAL | 0 refills | Status: AC | PRN

## 2024-10-05 MED ORDER — IOHEXOL 300 MG/ML  SOLN
100.0000 mL | Freq: Once | INTRAMUSCULAR | Status: AC | PRN
  Administered 2024-10-05: 100 mL via INTRAVENOUS

## 2024-10-05 MED ORDER — ONDANSETRON HCL 4 MG/2ML IJ SOLN
4.0000 mg | Freq: Once | INTRAMUSCULAR | Status: AC
  Administered 2024-10-05: 4 mg via INTRAVENOUS
  Filled 2024-10-05: qty 2

## 2024-10-05 MED ORDER — LACTATED RINGERS IV BOLUS
1000.0000 mL | Freq: Once | INTRAVENOUS | Status: AC
  Administered 2024-10-05: 1000 mL via INTRAVENOUS

## 2024-10-05 MED ORDER — ONDANSETRON 4 MG PO TBDP: 4.0000 mg | ORAL_TABLET | Freq: Three times a day (TID) | ORAL | 0 refills | Status: AC | PRN

## 2024-10-05 NOTE — Consult Note (Signed)
 Reason for Consult:ct with possible appendicitis Referring Physician: Dr Emil Amelie Thomas King is an 44 y.o. male.  HPI: 3 yom who has ab pain for that past week. He states this is epigastric and upper abdomen.  He has been having n/v/d. He has had diarrhea daily up to 10 times. No fevers. No prior surgery.  He has been to UC and ER with nothing getting better.  His wbc was normal 12/30 and is 12.6 today.  Cr elevated also. He had a ct scan done 12/31 that showed a 9 mm appendix with appendicolith and some scattered sb loops.  He represented as he was not better and has undergone another ct scan. This show the appendicolith and concern of early or evolving appendicitis with enteritis as well. He was transferred here for evaluation  Past Medical History:  Diagnosis Date   Diabetes mellitus without complication (HCC)    Frequent headaches    Hypertension     Past Surgical History:  Procedure Laterality Date   ARTHROSCOPIC REPAIR ACL Left    x's 2     Family History  Problem Relation Age of Onset   Hypertension Mother    Selective mutism Neg Hx    Sleep apnea Neg Hx     Social History:  reports that he has never smoked. He has never used smokeless tobacco. He reports that he does not currently use alcohol. He reports that he does not use drugs.  Allergies: Allergies[1]  Medications: Prior to Admission: (Not in a hospital admission)   Results for orders placed or performed during the hospital encounter of 10/05/24 (from the past 48 hours)  CBC with Differential     Status: Abnormal   Collection Time: 10/05/24  4:12 AM  Result Value Ref Range   WBC 12.6 (H) 4.0 - 10.5 K/uL   RBC 6.04 (H) 4.22 - 5.81 MIL/uL   Hemoglobin 17.2 (H) 13.0 - 17.0 g/dL   HCT 48.2 60.9 - 47.9 %   MCV 85.6 80.0 - 100.0 fL   MCH 28.5 26.0 - 34.0 pg   MCHC 33.3 30.0 - 36.0 g/dL   RDW 87.3 88.4 - 84.4 %   Platelets 396 150 - 400 K/uL   nRBC 0.0 0.0 - 0.2 %   Neutrophils Relative % 61 %   Neutro Abs 7.7  1.7 - 7.7 K/uL   Lymphocytes Relative 25 %   Lymphs Abs 3.1 0.7 - 4.0 K/uL   Monocytes Relative 7 %   Monocytes Absolute 0.9 0.1 - 1.0 K/uL   Eosinophils Relative 6 %   Eosinophils Absolute 0.8 (H) 0.0 - 0.5 K/uL   Basophils Relative 0 %   Basophils Absolute 0.0 0.0 - 0.1 K/uL   Immature Granulocytes 1 %   Abs Immature Granulocytes 0.07 0.00 - 0.07 K/uL    Comment: Performed at [facility], [address] [place], [place] [address]  Comprehensive metabolic panel     Status: Abnormal   Collection Time: 10/05/24  4:12 AM  Result Value Ref Range   Sodium 140 135 - 145 mmol/L   Potassium 3.2 (L) 3.5 - 5.1 mmol/L   Chloride 100 98 - 111 mmol/L   CO2 26 22 - 32 mmol/L   Glucose, Bld 176 (H) 70 - 99 mg/dL    Comment: Glucose reference range applies only to samples taken after fasting for at least 8 hours.   BUN 9 6 - 20 mg/dL   Creatinine, Ser 8.66 (H)  0.61 - 1.24 mg/dL   Calcium 9.4 8.9 - 89.6 mg/dL   Total Protein 7.6 6.5 - 8.1 g/dL   Albumin 4.3 3.5 - 5.0 g/dL   AST 18 15 - 41 U/L   ALT 17 0 - 44 U/L   Alkaline Phosphatase 115 38 - 126 U/L   Total Bilirubin 0.6 0.0 - 1.2 mg/dL   GFR, Estimated >39 >39 mL/min    Comment: (NOTE) Calculated using the CKD-EPI Creatinine Equation (2021)    Anion gap 15 5 - 15    Comment: Performed at [facility], [address] [place], [place] [address]  Lactic acid, plasma     Status: Abnormal   Collection Time: 10/05/24  4:12 AM  Result Value Ref Range   Lactic Acid, Venous 2.1 (HH) 0.5 - 1.9 mmol/L    Comment: Critical Value, Read Back and verified with Therisa Aly, RN at [phone] on 10/05/24 by SU Performed at [facility], [address] [place], [place] [address]   Lactic acid, plasma     Status: None   Collection Time: 10/05/24  5:55 AM  Result Value Ref Range   Lactic Acid, Venous 1.3 0.5 - 1.9 mmol/L    Comment: Performed at [facility], [address] [place], [place] [address]    CT  ABDOMEN PELVIS W CONTRAST Result Date: 10/05/2024 EXAM: CT ABDOMEN AND PELVIS WITH CONTRAST 10/05/2024 05:42:57 AM TECHNIQUE: CT of the abdomen and pelvis was performed with the administration of intravenous contrast. Multiplanar reformatted images are provided for review. Automated exposure control, iterative reconstruction, and/or weight-based adjustment of the mA/kV was utilized to reduce the radiation dose to as low as reasonably achievable. COMPARISON: 10/03/2024 CLINICAL HISTORY: Abdominal pain, acute, non-localized. Worsened epigastric and abdominal pain and diarrhea for 5 days. FINDINGS: LOWER CHEST: No acute abnormality. LIVER: Vicarious excretion of contrast material into the gallbladder noted. GALLBLADDER AND BILE DUCTS: Vicarious excretion of contrast material into the gallbladder noted. No pericholecystic inflammatory change. No biliary ductal dilatation. SPLEEN: No acute abnormality. PANCREAS: No acute abnormality. ADRENAL GLANDS: No acute abnormality. KIDNEYS, URETERS AND BLADDER: Several too small to characterize, Bosniak class 2 cysts within the left kidney measure up to 7 mm, as noted, image 42/[id number]. Per consensus, no follow-up is needed for simple Bosniak type 1 and 2 renal cysts, unless the patient has a malignancy history or risk factors. No stones in the kidneys or ureters. No hydronephrosis. No perinephric or periureteral stranding. Urinary bladder is unremarkable. GI AND BOWEL: Normal appearance of the stomach. Within the left lower quadrant of the abdomen, there are a few mildly dilated loops of small bowel measuring up to 3 cm. Within the distal small bowel there is mild small bowel wall thickening with wall thickness measuring up to 7 mm without significant inflammatory soft tissue stranding. The appendix is again seen in the right lower quadrant of the abdomen. Within the mid appendix, there is a 8 mm appendicolith. Here the appendix measures 1.1 cm in thickness. The wall of the appendix  shows enhancement, but there is not significant surrounding soft tissue stranding or free fluid. Liquid stool is noted within the colon. There is no bowel obstruction. Differential diagnosis includes early or evolving appendicitis, small bowel enteritis/ileitis, or partial small bowel obstruction. Clinical correlation is recommended. If clinical suspicion for appendicitis remains high, consider a follow-up CT in 12-24 hours or surgical consultation. PERITONEUM AND RETROPERITONEUM: No ascites. No free air. No signs  of pneumoperitoneum. There is no free fluid or fluid collections. Small fat-containing umbilical hernia. VASCULATURE: Aorta is normal in caliber. The abdominal vasculature is patent. LYMPH NODES: Mild haziness within the central mesentery with increased multiplicity of nonpathologically enlarged lymph nodes measuring up to 9 mm. REPRODUCTIVE ORGANS: The prostate gland is normal. BONES AND SOFT TISSUES: No acute osseous abnormality. No focal soft tissue abnormality. IMPRESSION: 1. Appendicolith with mildly focally thickened, appendix and minimal surrounding inflammation, concerning for early or evolving appendicitis; recommend surgical consultation and, if clinical suspicion remains uncertain, repeat CT in 12-24 hours. 2. Mild distal small bowel wall thickening without significant inflammatory change, possibly reflecting infectious or inflammatory enteritis/ileitis. 3. Similar mildly dilated small bowel loops in the left hemiabdomen measuring up to 3 cm in diameter. Findings may reflect ileus versus early/partial obstruction. Electronically signed by: Waddell Calk MD 10/05/2024 06:41 AM EST RP Workstation: [id number]   CT ABDOMEN PELVIS W CONTRAST Result Date: 10/04/2024 EXAM: CT ABDOMEN AND PELVIS WITH CONTRAST 10/03/2024 10:42:29 PM TECHNIQUE: CT of the abdomen and pelvis was performed with the administration of 100 mL of iohexol  (OMNIPAQUE ) 300 MG/ML solution. Multiplanar reformatted images are  provided for review. Automated exposure control, iterative reconstruction, and/or weight-based adjustment of the mA/kV was utilized to reduce the radiation dose to as low as reasonably achievable. COMPARISON: None available. CLINICAL HISTORY: Abdominal pain, acute, nonlocalized. FINDINGS: LOWER CHEST: No acute abnormality. LIVER: The liver is unremarkable. GALLBLADDER AND BILE DUCTS: Gallbladder is unremarkable. No biliary ductal dilatation. SPLEEN: No acute abnormality. PANCREAS: No acute abnormality. ADRENAL GLANDS: No acute abnormality. KIDNEYS, URETERS AND BLADDER: Subcentimeter cyst is present in the left kidney. Per consensus, no follow-up is needed for simple Bosniak type 1 and 2 renal cysts, unless the patient has a malignancy history or risk factors. No stones in the kidneys or ureters. No hydronephrosis. No perinephric or periureteral stranding. Urinary bladder is unremarkable. GI AND BOWEL: There is a moderate air fluid level in the stomach. There are air fluid levels throughout the colon. Appendicolith is present. Appendix measures up to 9 mm in thickness. There is no surrounding inflammation. There are some scattered mildly dilated fluid filled loops of bowel measuring up to 3.3 cm predominantly in the left abdomen without definite transition point. These findings are consistent with an ileus or partial obstruction. PERITONEUM AND RETROPERITONEUM: No ascites. No free air. There are nonenlarged retroperitoneal and central mesenteric lymph nodes. VASCULATURE: Aorta is normal in caliber. REPRODUCTIVE ORGANS: No acute abnormality. BONES AND SOFT TISSUES: No acute osseous abnormality. There is a small fat containing umbilical hernia. IMPRESSION: 1. Appendix measures up to 9 mm in thickness with an appendicolith, without surrounding inflammatory changes. Please correlate clinically for early acute appendicitis. 2. Scattered mildly dilated fluid-filled loops of bowel measuring up to 3.3 cm predominantly in the  left abdomen without a definite transition point, which may represent ileus or early/partial obstruction. 3. Moderate air-fluid level in the stomach. 4. Subcentimeter left renal cyst, likely benign, with no follow-up imaging recommended. Electronically signed by: Greig Pique MD 10/04/2024 12:45 AM EST RP Workstation: [id number]    Review of Systems  Constitutional:  Negative for fever.  Gastrointestinal:  Positive for abdominal pain (upper abdomen), diarrhea and nausea.  All other systems reviewed and are negative.  Blood pressure 114/78, pulse 78, temperature 98.6 F (37 C), temperature source Oral, resp. rate 17, weight 100.2 kg, SpO2 100%. Physical Exam Vitals reviewed.  Constitutional:      Appearance: He is well-developed.  Eyes:  General: No scleral icterus. Cardiovascular:     Rate and Rhythm: Normal rate and regular rhythm.  Pulmonary:     Effort: Pulmonary effort is normal.  Abdominal:     General: There is no distension.     Palpations: Abdomen is soft.     Tenderness: There is no abdominal tenderness.  Neurological:     Mental Status: He is alert.     Assessment/Plan: Enteritis -clinically this is not appendicitis. Symptoms for a week, nontender abdomen.   He has appendicolith but dont think ct proves this is appy.  He has an enteritis. No surgery indicated. Discussed this with he and his family -care per ER  I reviewed ED provider notes, last 24 h vitals and pain scores, last 48 h intake and output, last 24 h labs and trends, and last 24 h imaging results.    Donnice Bury 10/05/2024, 8:39 AM         [1]  Allergies Allergen Reactions   Other Shortness Of Breath and Rash    ALL SEAFOOD   Shellfish Allergy Rash, Shortness Of Breath and Hives

## 2024-10-05 NOTE — ED Triage Notes (Signed)
 Pt in with abdominal pain, emesis x 5 days. Pt went to UC earlier in the week, seen in ED yesterday and had CT showing possible early appendicitis but pt's symptoms improved and pt wished to go home. Pt returns with worsened symptoms, vomiting during triage.

## 2024-10-05 NOTE — ED Provider Notes (Signed)
 " [facility] Provider Note   CSN: [id number] Arrival date & time: 10/05/24  0350     History Chief Complaint  Patient presents with   Abdominal Pain   Emesis    HPI Thomas King is a 44 y.o. male presenting for recurrence of abdominal pain.  He is a 44 year old male whose had approximately 5 days of diffuse abdominal pain.  Primarily epigastric though initially some diffuse nature. Patient states its now quite diffuse. Was seen yesterday diagnosed as likely gastroenteritis and a viral syndrome. Had an equivocal CT but given clinical resolution was deemed stable for discharge.  He states that he was told to come back but symptoms recurred and he had ongoing vomiting he was unable to keep down his antiemetic today.  2 episodes nonbilious nonbloody vomiting including 1 in triage.   Patient's recorded medical, surgical, social, medication list and allergies were reviewed in the Snapshot window as part of the initial history.   Review of Systems   Review of Systems  Constitutional:  Negative for chills and fever.  HENT:  Negative for ear pain and sore throat.   Eyes:  Negative for pain and visual disturbance.  Respiratory:  Negative for cough and shortness of breath.   Cardiovascular:  Negative for chest pain and palpitations.  Gastrointestinal:  Positive for abdominal pain, nausea and vomiting.  Genitourinary:  Negative for dysuria and hematuria.  Musculoskeletal:  Negative for arthralgias and back pain.  Skin:  Negative for color change and rash.  Neurological:  Negative for seizures and syncope.  All other systems reviewed and are negative.   Physical Exam Updated Vital Signs BP 123/87 (BP Location: Right Arm)   Pulse 72   Temp 97.9 F (36.6 C) (Axillary)   Resp 19   Wt 100.2 kg   SpO2 97%   BMI 32.64 kg/m  Physical Exam Vitals and nursing note reviewed.  Constitutional:      General: He is not in acute distress.     Appearance: He is well-developed.  HENT:     Head: Normocephalic and atraumatic.  Eyes:     Conjunctiva/sclera: Conjunctivae normal.  Cardiovascular:     Rate and Rhythm: Normal rate and regular rhythm.     Heart sounds: No murmur heard. Pulmonary:     Effort: Pulmonary effort is normal. No respiratory distress.     Breath sounds: Normal breath sounds.  Abdominal:     Palpations: Abdomen is soft.     Tenderness: There is abdominal tenderness. There is no guarding.  Musculoskeletal:        General: No swelling.     Cervical back: Neck supple.  Skin:    General: Skin is warm and dry.     Capillary Refill: Capillary refill takes less than 2 seconds.  Neurological:     Mental Status: He is alert.  Psychiatric:        Mood and Affect: Mood normal.      ED Course/ Medical Decision Making/ A&P Clinical Course as of 10/06/24 9367  Thu Oct 05, 2024  9156 Reassessed, tells me diarrhea for 5 days, about 7-10 episodes of non bloody, no fever, watery loose stool with nausea and vomiting. No suspicious foods, no recent travel, no recent abx use. Given colitis on CT with try to get stool samples otherwise could consider this in OP setting and follow up with GI. [JB]  0849 Will PO challenge. [JB]  0900 Passed PO challenge.  Able to collect stool panel, will follow on MyChart.  Given follow up and return precautions. Since tolerating oral intake, well appearing, no sign of systemic illness not requiring admission. Given return precautions.  [JB]    Clinical Course User Index [JB] Barrett, Warren SAILOR, PA-C    Procedures Procedures   Medications Ordered in ED Medications  ondansetron  (ZOFRAN ) injection 4 mg (4 mg Intravenous Given 10/05/24 0434)  lactated ringers  bolus 1,000 mL (0 mLs Intravenous Stopped 10/05/24 0514)  iohexol  (OMNIPAQUE ) 300 MG/ML solution 100 mL (100 mLs Intravenous Contrast Given 10/05/24 0535)  Medical Decision Making:   Thomas King is a 44 y.o. male who presented to the  ED today with abdominal pain, detailed above.    Patient placed on continuous vitals and telemetry monitoring while in ED which was reviewed periodically.  Complete initial physical exam performed, notably the patient  was HDS in NAD.     Reviewed and confirmed nursing documentation for past medical history, family history, social history.    Initial Assessment:   With the patient's presentation of abdominal pain, most likely diagnosis is nonspecific etiology. Other diagnoses were considered including (but not limited to) gastroenteritis, colitis, small bowel obstruction, appendicitis, cholecystitis, pancreatitis, nephrolithiasis, UTI, pyleonephritis. These are considered less likely due to history of present illness and physical exam findings.   This is most consistent with an acute life/limb threatening illness complicated by underlying chronic conditions.   Initial Plan:  CBC/CMP to evaluate for underlying infectious/metabolic etiology for patient's abdominal pain  Lipase to evaluate for pancreatitis  EKG to evaluate for cardiac source of pain  CTAB/Pelvis with contrast to evaluate for structural/surgical etiology of patients' severe abdominal pain.  Urinalysis and repeat physical assessment to evaluate for UTI/Pyelonpehritis  Empiric management of symptoms with escalating pain control and antiemetics as needed.   Initial Study Results:   Laboratory  All laboratory results reviewed without evidence of clinically relevant pathology.   Exceptions include: Lactic acid elevated at [facility], leukocytosis from yesterday  Radiology All images reviewed independently. Agree with radiology report at this time.   CT ABDOMEN PELVIS W CONTRAST Result Date: 10/05/2024 EXAM: CT ABDOMEN AND PELVIS WITH CONTRAST 10/05/2024 05:42:57 AM TECHNIQUE: CT of the abdomen and pelvis was performed with the administration of intravenous contrast. Multiplanar reformatted images are provided for review. Automated  exposure control, iterative reconstruction, and/or weight-based adjustment of the mA/kV was utilized to reduce the radiation dose to as low as reasonably achievable. COMPARISON: 10/03/2024 CLINICAL HISTORY: Abdominal pain, acute, non-localized. Worsened epigastric and abdominal pain and diarrhea for 5 days. FINDINGS: LOWER CHEST: No acute abnormality. LIVER: Vicarious excretion of contrast material into the gallbladder noted. GALLBLADDER AND BILE DUCTS: Vicarious excretion of contrast material into the gallbladder noted. No pericholecystic inflammatory change. No biliary ductal dilatation. SPLEEN: No acute abnormality. PANCREAS: No acute abnormality. ADRENAL GLANDS: No acute abnormality. KIDNEYS, URETERS AND BLADDER: Several too small to characterize, Bosniak class 2 cysts within the left kidney measure up to 7 mm, as noted, image 42/301. Per consensus, no follow-up is needed for simple Bosniak type 1 and 2 renal cysts, unless the patient has a malignancy history or risk factors. No stones in the kidneys or ureters. No hydronephrosis. No perinephric or periureteral stranding. Urinary bladder is unremarkable. GI AND BOWEL: Normal appearance of the stomach. Within the left lower quadrant of the abdomen, there are a few mildly dilated loops of small bowel measuring up to 3 cm. Within the distal small bowel there is mild  small bowel wall thickening with wall thickness measuring up to 7 mm without significant inflammatory soft tissue stranding. The appendix is again seen in the right lower quadrant of the abdomen. Within the mid appendix, there is a 8 mm appendicolith. Here the appendix measures 1.1 cm in thickness. The wall of the appendix shows enhancement, but there is not significant surrounding soft tissue stranding or free fluid. Liquid stool is noted within the colon. There is no bowel obstruction. Differential diagnosis includes early or evolving appendicitis, small bowel enteritis/ileitis, or partial small bowel  obstruction. Clinical correlation is recommended. If clinical suspicion for appendicitis remains high, consider a follow-up CT in 12-24 hours or surgical consultation. PERITONEUM AND RETROPERITONEUM: No ascites. No free air. No signs of pneumoperitoneum. There is no free fluid or fluid collections. Small fat-containing umbilical hernia. VASCULATURE: Aorta is normal in caliber. The abdominal vasculature is patent. LYMPH NODES: Mild haziness within the central mesentery with increased multiplicity of nonpathologically enlarged lymph nodes measuring up to 9 mm. REPRODUCTIVE ORGANS: The prostate gland is normal. BONES AND SOFT TISSUES: No acute osseous abnormality. No focal soft tissue abnormality. IMPRESSION: 1. Appendicolith with mildly focally thickened, appendix and minimal surrounding inflammation, concerning for early or evolving appendicitis; recommend surgical consultation and, if clinical suspicion remains uncertain, repeat CT in 12-24 hours. 2. Mild distal small bowel wall thickening without significant inflammatory change, possibly reflecting infectious or inflammatory enteritis/ileitis. 3. Similar mildly dilated small bowel loops in the left hemiabdomen measuring up to 3 cm in diameter. Findings may reflect ileus versus early/partial obstruction. Electronically signed by: Waddell Calk MD 10/05/2024 06:41 AM EST RP Workstation: [id number]   CT ABDOMEN PELVIS W CONTRAST Result Date: 10/04/2024 EXAM: CT ABDOMEN AND PELVIS WITH CONTRAST 10/03/2024 10:42:29 PM TECHNIQUE: CT of the abdomen and pelvis was performed with the administration of 100 mL of iohexol  (OMNIPAQUE ) 300 MG/ML solution. Multiplanar reformatted images are provided for review. Automated exposure control, iterative reconstruction, and/or weight-based adjustment of the mA/kV was utilized to reduce the radiation dose to as low as reasonably achievable. COMPARISON: None available. CLINICAL HISTORY: Abdominal pain, acute, nonlocalized. FINDINGS:  LOWER CHEST: No acute abnormality. LIVER: The liver is unremarkable. GALLBLADDER AND BILE DUCTS: Gallbladder is unremarkable. No biliary ductal dilatation. SPLEEN: No acute abnormality. PANCREAS: No acute abnormality. ADRENAL GLANDS: No acute abnormality. KIDNEYS, URETERS AND BLADDER: Subcentimeter cyst is present in the left kidney. Per consensus, no follow-up is needed for simple Bosniak type 1 and 2 renal cysts, unless the patient has a malignancy history or risk factors. No stones in the kidneys or ureters. No hydronephrosis. No perinephric or periureteral stranding. Urinary bladder is unremarkable. GI AND BOWEL: There is a moderate air fluid level in the stomach. There are air fluid levels throughout the colon. Appendicolith is present. Appendix measures up to 9 mm in thickness. There is no surrounding inflammation. There are some scattered mildly dilated fluid filled loops of bowel measuring up to 3.3 cm predominantly in the left abdomen without definite transition point. These findings are consistent with an ileus or partial obstruction. PERITONEUM AND RETROPERITONEUM: No ascites. No free air. There are nonenlarged retroperitoneal and central mesenteric lymph nodes. VASCULATURE: Aorta is normal in caliber. REPRODUCTIVE ORGANS: No acute abnormality. BONES AND SOFT TISSUES: No acute osseous abnormality. There is a small fat containing umbilical hernia. IMPRESSION: 1. Appendix measures up to 9 mm in thickness with an appendicolith, without surrounding inflammatory changes. Please correlate clinically for early acute appendicitis. 2. Scattered mildly dilated fluid-filled loops of bowel  measuring up to 3.3 cm predominantly in the left abdomen without a definite transition point, which may represent ileus or early/partial obstruction. 3. Moderate air-fluid level in the stomach. 4. Subcentimeter left renal cyst, likely benign, with no follow-up imaging recommended. Electronically signed by: Greig Pique MD  10/04/2024 12:45 AM EST RP Workstation: [id number]   Final Reassessment and Plan:   CT with possible early appy. At this point, patient warrants specialist consult. They requested in person eva at [facility] long. Communicated with primary team who is in agreement with this plan.   Clinical Impression:  1. Abdominal pain, unspecified abdominal location   2. Nausea vomiting and diarrhea      Discharge   Final Clinical Impression(s) / ED Diagnoses Final diagnoses:  Abdominal pain, unspecified abdominal location  Nausea vomiting and diarrhea    Rx / DC Orders ED Discharge Orders          Ordered    loperamide (IMODIUM) 2 MG capsule  4 times daily PRN        10/05/24 0846    ondansetron  (ZOFRAN -ODT) 4 MG disintegrating tablet  Every 8 hours PRN        10/05/24 0846              Jerral Meth, MD 10/06/24 [phone]  "

## 2024-10-05 NOTE — Discharge Instructions (Addendum)
 Take zofran  as needed for nausea and vomiting.  Take Imodium or Pepto as needed for loose stool.  Try bland foods. Dry lots of water alternating Pedialyte or Gatorade drinks.  Follow up on stool samples.  Return to ED with new or worsening symptoms.

## 2024-10-05 NOTE — ED Provider Notes (Signed)
" °  Physical Exam  BP 114/78   Pulse 78   Temp 98.6 F (37 C) (Oral)   Resp 17   Wt 100.2 kg   SpO2 100%   BMI 32.64 kg/m   Physical Exam Constitutional:      General: He is not in acute distress.    Appearance: He is not toxic-appearing.  Cardiovascular:     Rate and Rhythm: Normal rate and regular rhythm.  Pulmonary:     Effort: Pulmonary effort is normal.     Breath sounds: Normal breath sounds.  Neurological:     Mental Status: He is alert.     Procedures  Procedures  ED Course / MDM   Clinical Course as of 10/05/24 0903  Thu Oct 05, 2024  9156 Reassessed, tells me diarrhea for 5 days, about 7-10 episodes of non bloody, no fever, watery loose stool with nausea and vomiting. No suspicious foods, no recent travel, no recent abx use. Given colitis on CT with try to get stool samples otherwise could consider this in OP setting and follow up with GI. [JB]  0849 Will PO challenge. [JB]  0900 Passed PO challenge.  Able to collect stool panel, will follow on MyChart.  Given follow up and return precautions. Since tolerating oral intake, well appearing, no sign of systemic illness not requiring admission. Given return precautions.  [JB]    Clinical Course User Index [JB] Tarvaris Puglia, Warren SAILOR, PA-C   Medical Decision Making Amount and/or Complexity of Data Reviewed Labs: ordered. Radiology: ordered.  Risk Prescription drug management.   Patient was sent from [facility] to [facility] for general surgery evaluation.  He was found to have equivocal CT changes for early acute appendicitis.  Dr Ebbie evaluated patient in room and clinically not concerned for appendicitis at this time, not going to OR.     Shermon Warren SAILOR, PA-C 10/05/24 9096    Kingsley, Victoria K, DO 10/05/24 [phone]  "

## 2024-10-05 NOTE — Anesthesia Preprocedure Evaluation (Signed)
"                                    Anesthesia Evaluation    Reviewed: Allergy & Precautions, Patient's Chart, lab work & pertinent test results  History of Anesthesia Complications Negative for: history of anesthetic complications  Airway        Dental   Pulmonary sleep apnea           Cardiovascular hypertension, Pt. on medications      Neuro/Psych  Headaches  negative psych ROS   GI/Hepatic Neg liver ROS,,, Appendicitis    Endo/Other  diabetes, Type 2, Insulin  Dependent   Obesity On GLP-1a K 3.2  Renal/GU negative Renal ROS     Musculoskeletal negative musculoskeletal ROS (+)    Abdominal   Peds  Hematology negative hematology ROS (+)   Anesthesia Other Findings   Reproductive/Obstetrics                              Anesthesia Physical Anesthesia Plan  ASA: 2  Anesthesia Plan: General   Post-op Pain Management: Ofirmev  IV (intra-op)* and Toradol  IV (intra-op)*   Induction: Intravenous and Rapid sequence  PONV Risk Score and Plan: 2 and Treatment may vary due to age or medical condition, Ondansetron , Dexamethasone and Midazolam  Airway Management Planned: Oral ETT  Additional Equipment: None  Intra-op Plan:   Post-operative Plan: Extubation in OR  Informed Consent:   Plan Discussed with: CRNA and Anesthesiologist  Anesthesia Plan Comments:         Anesthesia Quick Evaluation  "

## 2024-10-05 NOTE — ED Notes (Addendum)
 Patient has sleep apnea and SPO2 dropped to 87% while asleep.  Patient has been diagnosed with sleep apnea.  Placed patient on 3 liter nasal cannula.  Patient tolerating well and SPO2 is 99%.

## 2024-10-06 LAB — GASTROINTESTINAL PANEL BY PCR, STOOL (REPLACES STOOL CULTURE)

## 2024-10-24 ENCOUNTER — Ambulatory Visit: Admitting: Physician Assistant

## 2024-10-24 ENCOUNTER — Encounter: Payer: Self-pay | Admitting: Physician Assistant

## 2024-10-24 VITALS — BP 140/85

## 2024-10-24 DIAGNOSIS — L219 Seborrheic dermatitis, unspecified: Secondary | ICD-10-CM | POA: Diagnosis not present

## 2024-10-24 MED ORDER — FLUOCINOLONE ACETONIDE SCALP 0.01 % EX OIL: 1.0000 "application " | TOPICAL_OIL | CUTANEOUS | 3 refills | Status: DC

## 2024-10-24 MED ORDER — DERMA-SMOOTHE/FS SCALP 0.01 % EX OIL: 1.0000 "application " | TOPICAL_OIL | CUTANEOUS | 3 refills | Status: AC

## 2024-10-24 MED ORDER — CLOBETASOL PROPIONATE 0.05 % EX SOLN: 1.0000 | Freq: Two times a day (BID) | CUTANEOUS | 2 refills | Status: AC

## 2024-10-24 MED ORDER — KETOCONAZOLE 2 % EX SHAM: 1.0000 | MEDICATED_SHAMPOO | CUTANEOUS | 6 refills | Status: AC

## 2024-10-24 NOTE — Progress Notes (Signed)
" ° °  Follow-Up Visit   Subjective  Thomas King is a 44 y.o. male NEW PATIENT who presents for the following: Dandruff of scalp - Using ciclopirox  shampoo for scalp given to by PCP States its not working, pt has itchy and sometimes painful scalp with flakiness.       The following portions of the chart were reviewed this encounter and updated as appropriate: medications, allergies, medical history  Review of Systems:  No other skin or systemic complaints except as noted in HPI or Assessment and Plan.  Objective  Well appearing patient in no apparent distress; mood and affect are within normal limits.   A focused examination was performed of the following areas: Scalp, face, ears   Relevant exam findings are noted in the Assessment and Plan.    Assessment & Plan   SEBORRHEIC DERMATITIS Exam: Pink patches with greasy scale at scalp and ears   Seborrheic Dermatitis is a chronic persistent rash characterized by pinkness and scaling most commonly of the mid face but also can occur on the scalp (dandruff), ears; mid chest, mid back and groin.  It tends to be exacerbated by stress and cooler weather.  People who have neurologic disease may experience new onset or exacerbation of existing seborrheic dermatitis.  The condition is not curable but treatable and can be controlled.  Treatment Plan: Ketoconazole  2% shampoo Shampoo scalp once or twice daily  Clobetasol  solution Apply to scalp daily in the morning as needed  DermaSmoothe scalp oil Apply to damp scalp nightly as needed      Return in about 3 months (around 01/22/2025) for [facility] follow up.  I, Thomas King, CMA, am acting as scribe for Douglas Smolinsky K, PA-C .   Documentation: I have reviewed the above documentation for accuracy and completeness, and I agree with the above.  Thomas Sahli K, PA-C         "

## 2024-10-24 NOTE — Patient Instructions (Addendum)
 Topical steroids (such as triamcinolone, fluocinolone, fluocinonide, mometasone, clobetasol, halobetasol, betamethasone, hydrocortisone) can cause thinning and lightening of the skin if they are used for too long in the same area. Your physician has selected the right strength medicine for your problem and area affected on the body. Please use your medication only as directed by your physician to prevent side effects.    Important Information  Due to recent changes in healthcare laws, you may see results of your pathology and/or laboratory studies on MyChart before the doctors have had a chance to review them. We understand that in some cases there may be results that are confusing or concerning to you. Please understand that not all results are received at the same time and often the doctors may need to interpret multiple results in order to provide you with the best plan of care or course of treatment. Therefore, we ask that you please give [place] 2 business days to thoroughly review all your results before contacting the office for clarification. Should we see a critical lab result, you will be contacted sooner.   If You Need Anything After Your Visit  If you have any questions or concerns for your doctor, please call our main line at [phone] If no one answers, please leave a voicemail as directed and we will return your call as soon as possible. Messages left after 4 pm will be answered the following business day.   You may also send [place] a message via MyChart. We typically respond to MyChart messages within 1-2 business days.  For prescription refills, please ask your pharmacy to contact our office. Our fax number is [phone].  If you have an urgent issue when the clinic is closed that cannot wait until the next business day, you can page your doctor at the number below.    Please note that while we do our best to be available for urgent issues outside of office hours, we are not  available 24/7.   If you have an urgent issue and are unable to reach [place], you may choose to seek medical care at your doctor's office, retail clinic, urgent care center, or emergency room.  If you have a medical emergency, please immediately call 911 or go to the emergency department. In the event of inclement weather, please call our main line at [phone] for an update on the status of any delays or closures.  Dermatology Medication Tips: Please keep the boxes that topical medications come in in order to help keep track of the instructions about where and how to use these. Pharmacies typically print the medication instructions only on the boxes and not directly on the medication tubes.   If your medication is too expensive, please contact our office at [phone] or send [place] a message through MyChart.   We are unable to tell what your co-pay for medications will be in advance as this is different depending on your insurance coverage. However, we may be able to find a substitute medication at lower cost or fill out paperwork to get insurance to cover a needed medication.   If a prior authorization is required to get your medication covered by your insurance company, please allow [place] 1-2 business days to complete this process.  Drug prices often vary depending on where the prescription is filled and some pharmacies may offer cheaper prices.  The website www.goodrx.com contains coupons for medications through different pharmacies. The prices here do not account for what  the cost may be with help from insurance (it may be cheaper with your insurance), but the website can give you the price if you did not use any insurance.  - You can print the associated coupon and take it with your prescription to the pharmacy.  - You may also stop by our office during regular business hours and pick up a GoodRx coupon card.  - If you need your prescription sent electronically to a different pharmacy, notify  our office through [facility] or by phone at [phone]

## 2024-10-26 ENCOUNTER — Encounter: Payer: Self-pay | Admitting: Family Medicine

## 2024-10-26 ENCOUNTER — Other Ambulatory Visit: Payer: Self-pay | Admitting: Family Medicine

## 2024-10-26 DIAGNOSIS — E1165 Type 2 diabetes mellitus with hyperglycemia: Secondary | ICD-10-CM

## 2024-10-26 MED ORDER — INSULIN ASPART 100 UNIT/ML FLEXPEN: 5.0000 [IU] | PEN_INJECTOR | Freq: Three times a day (TID) | SUBCUTANEOUS | 5 refills | Status: AC

## 2024-10-26 MED ORDER — TIRZEPATIDE 5 MG/0.5ML ~~LOC~~ SOAJ: 5.0000 mg | SUBCUTANEOUS | 3 refills | Status: AC

## 2024-10-26 MED ORDER — INSULIN GLARGINE 100 UNIT/ML SOLOSTAR PEN: 22.0000 [IU] | PEN_INJECTOR | Freq: Every day | SUBCUTANEOUS | 5 refills | Status: AC

## 2024-11-05 ENCOUNTER — Encounter: Payer: Self-pay | Admitting: Physician Assistant

## 2024-11-05 ENCOUNTER — Other Ambulatory Visit: Payer: Self-pay | Admitting: Family Medicine

## 2024-11-05 DIAGNOSIS — E1110 Type 2 diabetes mellitus with ketoacidosis without coma: Secondary | ICD-10-CM

## 2025-01-08 ENCOUNTER — Ambulatory Visit: Admitting: Physician Assistant

## 2025-03-16 ENCOUNTER — Encounter: Admitting: Dietician
